# Patient Record
Sex: Male | Born: 1980 | Race: White | Hispanic: No | Marital: Married | Smoking: Current every day smoker
Health system: Southern US, Community
[De-identification: ages and names within clinical notes are randomized; demographics above are authoritative.]

## PROBLEM LIST (undated history)

## (undated) DIAGNOSIS — G4700 Insomnia, unspecified: Secondary | ICD-10-CM

## (undated) DIAGNOSIS — F99 Mental disorder, not otherwise specified: Secondary | ICD-10-CM

## (undated) DIAGNOSIS — I10 Essential (primary) hypertension: Secondary | ICD-10-CM

## (undated) DIAGNOSIS — F419 Anxiety disorder, unspecified: Secondary | ICD-10-CM

## (undated) DIAGNOSIS — F319 Bipolar disorder, unspecified: Secondary | ICD-10-CM

## (undated) DIAGNOSIS — K219 Gastro-esophageal reflux disease without esophagitis: Secondary | ICD-10-CM

## (undated) DIAGNOSIS — F1990 Other psychoactive substance use, unspecified, uncomplicated: Secondary | ICD-10-CM

## (undated) DIAGNOSIS — F909 Attention-deficit hyperactivity disorder, unspecified type: Secondary | ICD-10-CM

---

## 1999-09-27 ENCOUNTER — Emergency Department (HOSPITAL_COMMUNITY): Admission: EM | Admit: 1999-09-27 | Discharge: 1999-09-27 | Payer: Self-pay | Admitting: *Deleted

## 1999-11-11 ENCOUNTER — Emergency Department (HOSPITAL_COMMUNITY): Admission: EM | Admit: 1999-11-11 | Discharge: 1999-11-11 | Payer: Self-pay | Admitting: Emergency Medicine

## 1999-12-23 ENCOUNTER — Emergency Department (HOSPITAL_COMMUNITY): Admission: EM | Admit: 1999-12-23 | Discharge: 1999-12-23 | Payer: Self-pay | Admitting: Emergency Medicine

## 1999-12-23 ENCOUNTER — Encounter: Payer: Self-pay | Admitting: Emergency Medicine

## 2000-06-20 ENCOUNTER — Emergency Department (HOSPITAL_COMMUNITY): Admission: EM | Admit: 2000-06-20 | Discharge: 2000-06-20 | Payer: Self-pay | Admitting: Emergency Medicine

## 2000-06-20 ENCOUNTER — Encounter: Payer: Self-pay | Admitting: Emergency Medicine

## 2000-07-20 ENCOUNTER — Encounter: Payer: Self-pay | Admitting: Emergency Medicine

## 2000-07-20 ENCOUNTER — Emergency Department (HOSPITAL_COMMUNITY): Admission: EM | Admit: 2000-07-20 | Discharge: 2000-07-21 | Payer: Self-pay | Admitting: Emergency Medicine

## 2007-04-13 ENCOUNTER — Emergency Department (HOSPITAL_COMMUNITY): Admission: EM | Admit: 2007-04-13 | Discharge: 2007-04-13 | Payer: Self-pay | Admitting: Emergency Medicine

## 2007-06-29 ENCOUNTER — Emergency Department: Payer: Self-pay | Admitting: Emergency Medicine

## 2007-07-21 ENCOUNTER — Emergency Department (HOSPITAL_COMMUNITY): Admission: EM | Admit: 2007-07-21 | Discharge: 2007-07-21 | Payer: Self-pay | Admitting: Emergency Medicine

## 2007-07-21 ENCOUNTER — Ambulatory Visit (HOSPITAL_COMMUNITY): Admission: RE | Admit: 2007-07-21 | Discharge: 2007-07-21 | Payer: Self-pay | Admitting: Family Medicine

## 2007-07-27 ENCOUNTER — Emergency Department (HOSPITAL_COMMUNITY): Admission: EM | Admit: 2007-07-27 | Discharge: 2007-07-27 | Payer: Self-pay | Admitting: Emergency Medicine

## 2009-04-19 ENCOUNTER — Emergency Department (HOSPITAL_COMMUNITY): Admission: EM | Admit: 2009-04-19 | Discharge: 2009-04-20 | Payer: Self-pay | Admitting: Emergency Medicine

## 2009-12-30 IMAGING — CR DG FOOT COMPLETE 3+V*L*
1 series · 3 of 3 positions shown · non-contrast
Comparison: none

REASON FOR EXAM: PAINFUL AND SWOLLEN FROM FALL
COMMENTS:

PROCEDURE:     DXR - DXR FOOT LT COMP W/OBLIQUES  - June 29, 2007  [DATE]
RESULT:     Comparison: No available comparison exam.

[Series 1: view not recorded · 0.17mm/px · 3 of 3 slices shown]
[im 1/3]
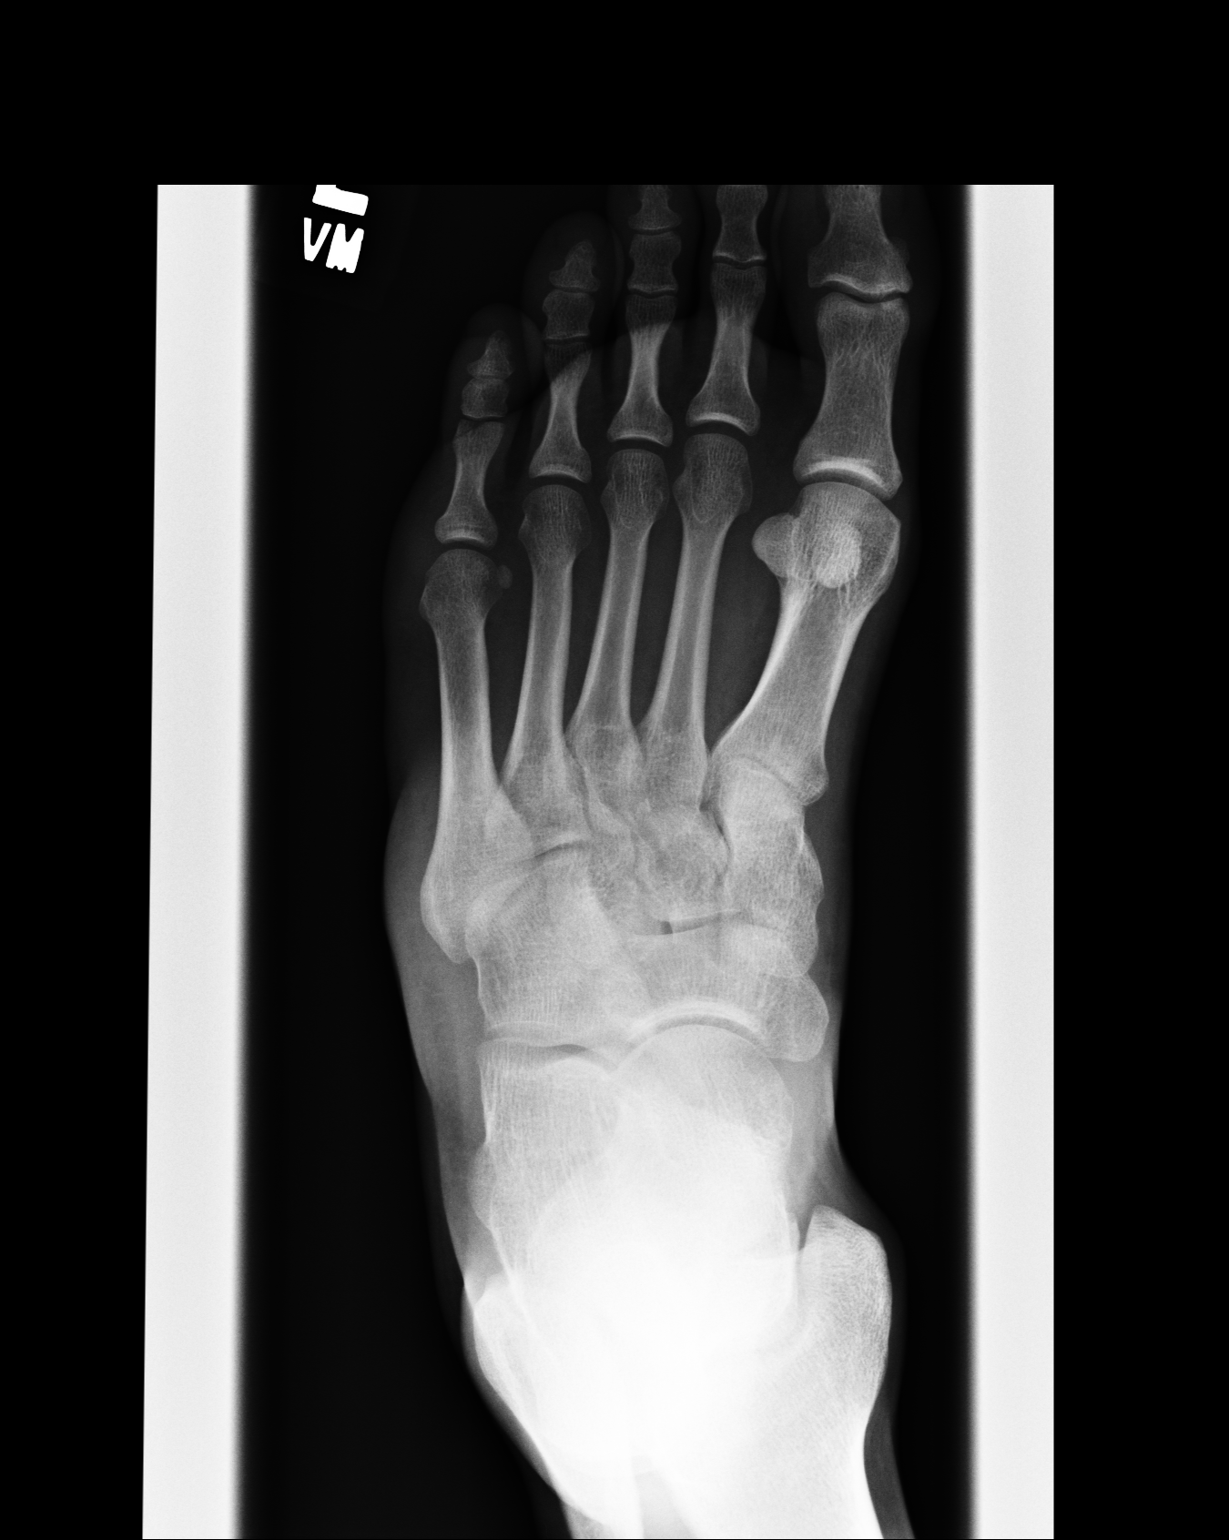
[im 2/3]
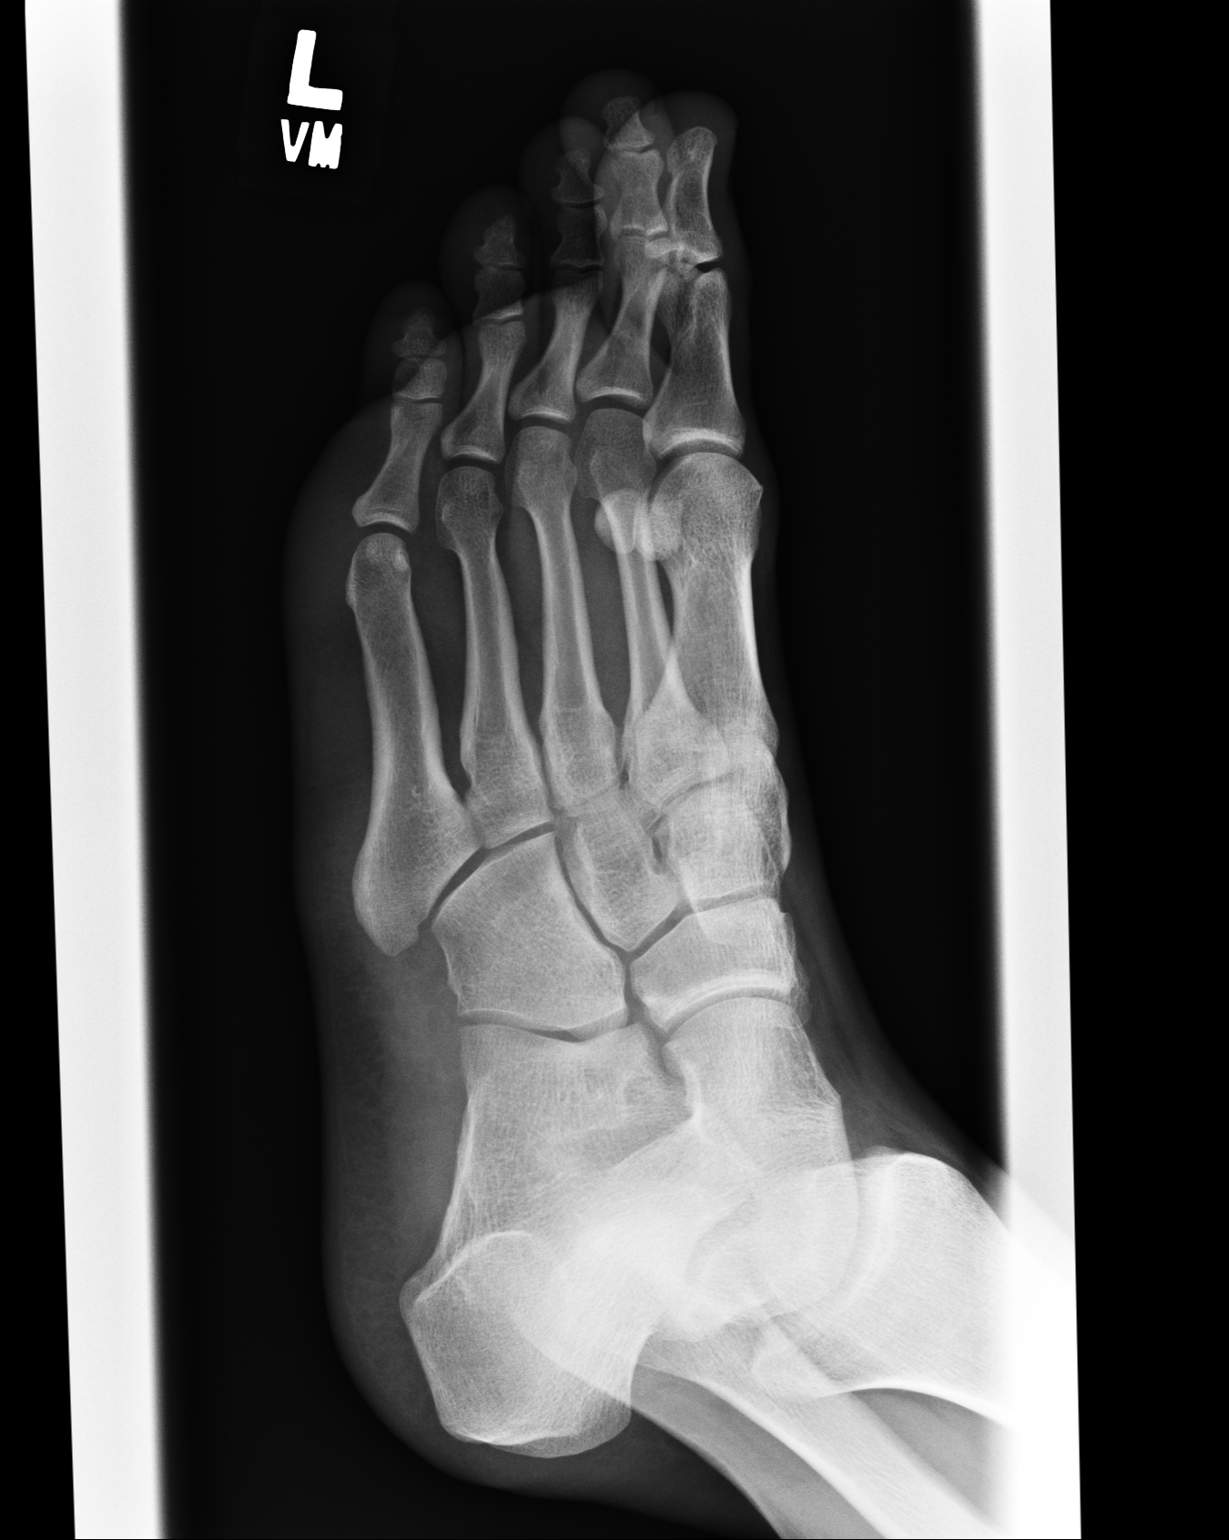
[im 3/3]
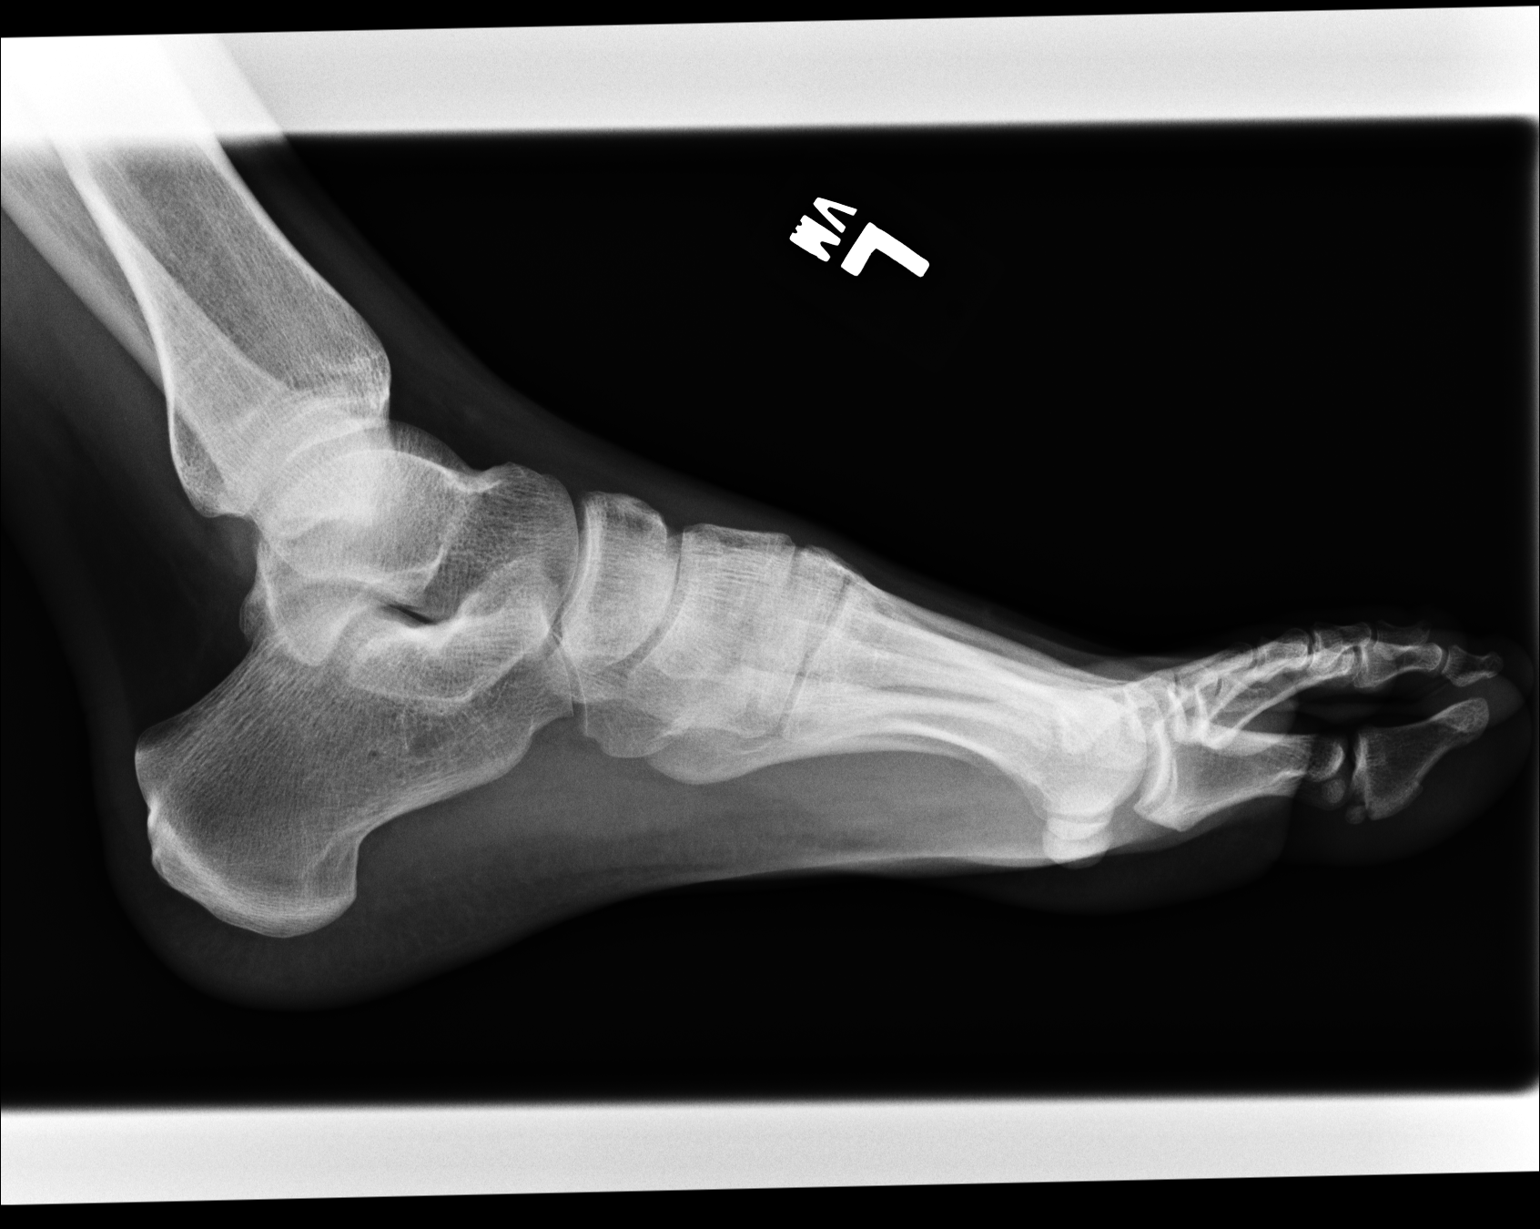

[3 of 3 positions shown; findings below may reference images not displayed]

FINDINGS: Three views of the left foot were obtained.

No fracture or dislocation of the left foot is noted. No significant joint
abnormality is seen.
IMPRESSION: 1. No fracture or dislocation of the left foot is noted.

## 2010-04-22 LAB — POCT I-STAT, CHEM 8
Chloride: 108 mEq/L (ref 96–112)
HCT: 46 % (ref 39.0–52.0)
Hemoglobin: 15.6 g/dL (ref 13.0–17.0)
Potassium: 3.7 mEq/L (ref 3.5–5.1)
Sodium: 140 mEq/L (ref 135–145)

## 2010-04-22 LAB — URINALYSIS, ROUTINE W REFLEX MICROSCOPIC
Bilirubin Urine: NEGATIVE
Glucose, UA: NEGATIVE mg/dL
Hgb urine dipstick: NEGATIVE
Ketones, ur: 15 mg/dL — AB
Nitrite: NEGATIVE
Protein, ur: NEGATIVE mg/dL
Specific Gravity, Urine: 1.023 (ref 1.005–1.030)
Urobilinogen, UA: 0.2 mg/dL (ref 0.0–1.0)
pH: 6 (ref 5.0–8.0)

## 2010-04-22 LAB — DIFFERENTIAL
Basophils Relative: 0 % (ref 0–1)
Eosinophils Absolute: 0 10*3/uL (ref 0.0–0.7)
Lymphs Abs: 1.6 10*3/uL (ref 0.7–4.0)
Neutro Abs: 8.6 10*3/uL — ABNORMAL HIGH (ref 1.7–7.7)
Neutrophils Relative %: 78 % — ABNORMAL HIGH (ref 43–77)

## 2010-04-22 LAB — CBC
MCHC: 34.6 g/dL (ref 30.0–36.0)
MCV: 90.1 fL (ref 78.0–100.0)
Platelets: 229 10*3/uL (ref 150–400)
WBC: 11 10*3/uL — ABNORMAL HIGH (ref 4.0–10.5)

## 2010-04-22 LAB — RAPID URINE DRUG SCREEN, HOSP PERFORMED
Amphetamines: NOT DETECTED
Barbiturates: NOT DETECTED
Benzodiazepines: POSITIVE — AB
Tetrahydrocannabinol: POSITIVE — AB

## 2010-10-24 LAB — I-STAT 8, (EC8 V) (CONVERTED LAB)
Acid-base deficit: 2
HCT: 47
Hemoglobin: 16
Potassium: 3.5
Sodium: 139
TCO2: 20

## 2010-10-27 LAB — DIFFERENTIAL
Basophils Absolute: 0
Basophils Relative: 0
Basophils Relative: 0
Eosinophils Absolute: 0.2
Eosinophils Relative: 2
Lymphocytes Relative: 24
Lymphs Abs: 2.7
Monocytes Absolute: 0.8
Monocytes Absolute: 1.1 — ABNORMAL HIGH
Monocytes Relative: 10
Monocytes Relative: 8
Neutro Abs: 7.3
Neutro Abs: 8.3 — ABNORMAL HIGH
Neutrophils Relative %: 64

## 2010-10-27 LAB — CBC
HCT: 41.4
HCT: 44
Hemoglobin: 14.1
Hemoglobin: 15.5
MCHC: 34
MCHC: 35.2
MCV: 87.7
MCV: 88.3
Platelets: 291
RBC: 4.69
RBC: 5.02
RDW: 12.2
WBC: 11.3 — ABNORMAL HIGH

## 2010-10-27 LAB — COMPREHENSIVE METABOLIC PANEL
ALT: 12
AST: 15
Albumin: 4.2
Alkaline Phosphatase: 68
BUN: 11
CO2: 30
Calcium: 9.5
Chloride: 106
Creatinine, Ser: 0.97
GFR calc Af Amer: >60
GFR calc non Af Amer: >60
Glucose, Bld: 76
Potassium: 4.2
Sodium: 141
Total Bilirubin: 0.6
Total Protein: 7.3

## 2010-10-27 LAB — POCT URINALYSIS DIP (DEVICE)
Bilirubin Urine: NEGATIVE
Glucose, UA: NEGATIVE
Hgb urine dipstick: NEGATIVE
Specific Gravity, Urine: 1.025

## 2013-03-05 ENCOUNTER — Emergency Department (HOSPITAL_COMMUNITY)
Admission: EM | Admit: 2013-03-05 | Discharge: 2013-03-05 | Disposition: A | Discharge status: Home / Self Care | Payer: Medicaid Other | Attending: Emergency Medicine | Admitting: Emergency Medicine

## 2013-03-05 ENCOUNTER — Encounter (HOSPITAL_COMMUNITY): Payer: Self-pay | Admitting: Emergency Medicine

## 2013-03-05 DIAGNOSIS — Z79899 Other long term (current) drug therapy: Secondary | ICD-10-CM | POA: Insufficient documentation

## 2013-03-05 DIAGNOSIS — F17200 Nicotine dependence, unspecified, uncomplicated: Secondary | ICD-10-CM | POA: Insufficient documentation

## 2013-03-05 DIAGNOSIS — F1910 Other psychoactive substance abuse, uncomplicated: Secondary | ICD-10-CM

## 2013-03-05 DIAGNOSIS — F1310 Sedative, hypnotic or anxiolytic abuse, uncomplicated: Secondary | ICD-10-CM | POA: Insufficient documentation

## 2013-03-05 DIAGNOSIS — F1410 Cocaine abuse, uncomplicated: Secondary | ICD-10-CM | POA: Insufficient documentation

## 2013-03-05 HISTORY — DX: Anxiety disorder, unspecified: F41.9

## 2013-03-05 HISTORY — DX: Mental disorder, not otherwise specified: F99

## 2013-03-05 LAB — BASIC METABOLIC PANEL
BUN: 12 mg/dL (ref 6–23)
CALCIUM: 9.8 mg/dL (ref 8.4–10.5)
CHLORIDE: 100 meq/L (ref 96–112)
CO2: 24 mEq/L (ref 19–32)
CREATININE: 0.96 mg/dL (ref 0.50–1.35)
GFR calc non Af Amer: >90 mL/min (ref >90)
Glucose, Bld: 130 mg/dL — ABNORMAL HIGH (ref 70–99)
Potassium: 4.5 mEq/L (ref 3.7–5.3)
SODIUM: 137 meq/L (ref 137–147)

## 2013-03-05 LAB — CBC WITH DIFFERENTIAL/PLATELET
BASOS ABS: 0 10*3/uL (ref 0.0–0.1)
BASOS PCT: 0 % (ref 0–1)
Eosinophils Absolute: 0 10*3/uL (ref 0.0–0.7)
Eosinophils Relative: 0 % (ref 0–5)
HEMATOCRIT: 45.1 % (ref 39.0–52.0)
Hemoglobin: 15.6 g/dL (ref 13.0–17.0)
Lymphocytes Relative: 15 % (ref 12–46)
Lymphs Abs: 1.6 10*3/uL (ref 0.7–4.0)
MCH: 30 pg (ref 26.0–34.0)
MCHC: 34.6 g/dL (ref 30.0–36.0)
MCV: 86.7 fL (ref 78.0–100.0)
MONO ABS: 0.7 10*3/uL (ref 0.1–1.0)
Monocytes Relative: 6 % (ref 3–12)
NEUTROS ABS: 8.3 10*3/uL — AB (ref 1.7–7.7)
NEUTROS PCT: 78 % — AB (ref 43–77)
PLATELETS: 278 10*3/uL (ref 150–400)
RBC: 5.2 MIL/uL (ref 4.22–5.81)
RDW: 12.9 % (ref 11.5–15.5)
WBC: 10.7 10*3/uL — AB (ref 4.0–10.5)

## 2013-03-05 LAB — RAPID URINE DRUG SCREEN, HOSP PERFORMED
AMPHETAMINES: NOT DETECTED
Barbiturates: NOT DETECTED
Benzodiazepines: NOT DETECTED
COCAINE: POSITIVE — AB
OPIATES: NOT DETECTED
Tetrahydrocannabinol: POSITIVE — AB

## 2013-03-05 LAB — ETHANOL: Alcohol, Ethyl (B): <11 mg/dL (ref 0–11)

## 2013-03-05 LAB — ACETAMINOPHEN LEVEL

## 2013-03-05 LAB — SALICYLATE LEVEL

## 2013-03-05 MED ORDER — NICOTINE 21 MG/24HR TD PT24: 21.0000 mg | MEDICATED_PATCH | Freq: Every day | TRANSDERMAL | Status: DC

## 2013-03-05 MED ORDER — ACETAMINOPHEN 325 MG PO TABS: 650.0000 mg | ORAL_TABLET | ORAL | Status: DC | PRN

## 2013-03-05 MED ORDER — ONDANSETRON HCL 4 MG PO TABS: 4.0000 mg | ORAL_TABLET | Freq: Three times a day (TID) | ORAL | Status: DC | PRN

## 2013-03-05 MED ORDER — ZOLPIDEM TARTRATE 5 MG PO TABS
5.0000 mg | ORAL_TABLET | Freq: Every evening | ORAL | Status: DC | PRN
Start: 2013-03-05 — End: 2013-03-05

## 2013-03-05 MED ORDER — IBUPROFEN 200 MG PO TABS: 600.0000 mg | ORAL_TABLET | Freq: Three times a day (TID) | ORAL | Status: DC | PRN

## 2013-03-05 MED ORDER — LORAZEPAM 1 MG PO TABS
1.0000 mg | ORAL_TABLET | Freq: Four times a day (QID) | ORAL | Status: DC | PRN
Start: 2013-03-05 — End: 2013-03-05

## 2013-03-05 NOTE — ED Notes (Signed)
Pt transferred from triage, presents for detox off cocaine, opiates, and Xanax.  Last used 11am.  Reports pt relapsed 3 wks ago.  Denies feeling hopeless.  Denies SI, HI or AV hallucinations.  Admits to past hx of Bipolar DO, ADHD, Anxiety and Insomnia.  Pt calm & cooperative at present.

## 2013-03-05 NOTE — ED Notes (Signed)
Patient has been wanded by security. Patients wife will come pick up belongings from triage nursing station.

## 2013-03-05 NOTE — BH Assessment (Signed)
Pt assessed by TTS and inpatient detox recommended. Pt's nurse Rashell informed TTS that patient is requesting to leave as he does not want to wait any longer. Rashell reports that EDP has been notified about the situation.   Erik PeachNajah Jonovan Boedecker, MS, LCASA Assessment Counselor

## 2013-03-05 NOTE — ED Notes (Signed)
Writer called Dr. Gwendolyn GrantWalden and informed him that patient wanted to leave. Dr. Gwendolyn GrantWalden told me to tell patient he would be over to talk with him. Writer informed patient that the Dr. would be over to speak with him.

## 2013-03-05 NOTE — ED Notes (Signed)
Dr. Gwendolyn GrantWalden in to speak with patient.

## 2013-03-05 NOTE — BH Assessment (Signed)
Consulted with EDP Dr. Gwendolyn GrantWalden who consulted with patient and D/C pt as pt refused to wait for detox placement.   Glorious PeachNajah Leathia Farnell, MS, LCASA Assessment Counselor

## 2013-03-05 NOTE — ED Notes (Signed)
Informed patient what Renda Rollsajah, TTS counselor stated and he reported that he was still leaving.

## 2013-03-05 NOTE — BH Assessment (Signed)
Consulted with PA Jaynie Crumbleatyana Kirichenko to obtain clinicals prior to assessing patient.  Glorious PeachNajah Paxton Kanaan, MS, LCASA Assessment Counselor

## 2013-03-05 NOTE — ED Notes (Signed)
No acute distress noted. Patient denies SI, HI and AVH

## 2013-03-05 NOTE — ED Notes (Signed)
Per pt, states he wants detox off cocaine, opiates and benzos-relapsed 3 weeks ago-last used 3 hours ago

## 2013-03-05 NOTE — Discharge Instructions (Signed)
Drug Abuse and Addiction in Sports There are many types of drugs that one may become addicted to including illegal drugs (marijuana, cocaine, amphetamines, hallucinogens, and narcotics), prescription drugs (hydrocodone, codeine, and alprazolam), and other chemicals such as alcohol or nicotine. Two types of addiction exist: physical and emotional. Physical addiction usually occurs after prolonged use of a drug. However, some drugs may only take a couple uses before addiction can occur. Physical addiction is marked by withdrawal symptoms, in which the person experiences negative symptoms such as sweat, anxiety, tremors, hallucinations, or cravings in the absence of using the drug. Emotional dependence is the psychological desire for the "high" that the drugs produce when taken. SYMPTOMS   Inattentiveness.  Negligence.  Forgetfulness.  Insomnia.  Mood swings. RISK INCREASES WITH:   Family history of addiction.  Personal history of addictive personality. Studies have shown that risktakers, which many athletes are, have a higher risk of addiction. PREVENTION The only adequate prevention of drug abuse is abstinence from drugs. TREATMENT  The first step in quitting substance abuse is recognizing the problem and realizing that one has the power to change. Quitting requires a plan and support from others. It is often necessary to seek medical assistance. Caregivers are available to offer counseling, and for certain cases, medicine to diminish the physical symptoms of withdrawal. Many organizations exist such as Alcoholics Anonymous, Narcotics Anonymous, or the [employer] Council on Alcoholism that offer support for individuals who have chosen to quit their habits. Document Released: 01/16/2005 Document Revised: 04/10/2011 Document Reviewed: 04/30/2008 [facility] Patient Information 2014 [place], [place].   Emergency Department Resource Guide 1) Find a Doctor and Pay Out of Pocket Although you  won't have to find out who is covered by your insurance plan, it is a good idea to ask around and get recommendations. You will then need to call the office and see if the doctor you have chosen will accept you as a new patient and what types of options they offer for patients who are self-pay. Some doctors offer discounts or will set up payment plans for their patients who do not have insurance, but you will need to ask so you aren't surprised when you get to your appointment.  2) Contact Your Local Health Department Not all health departments have doctors that can see patients for sick visits, but many do, so it is worth a call to see if yours does. If you don't know where your local health department is, you can check in your phone book. The CDC also has a tool to help you locate your state's health department, and many state websites also have listings of all of their local health departments.  3) Find a Walk-in Clinic If your illness is not likely to be very severe or complicated, you may want to try a walk in clinic. These are popping up all over the country in pharmacies, drugstores, and shopping centers. They're usually staffed by nurse practitioners or physician assistants that have been trained to treat common illnesses and complaints. They're usually fairly quick and inexpensive. However, if you have serious medical issues or chronic medical problems, these are probably not your best option.  No Primary Care Doctor: - Call Health Connect at  [phone] - they can help you locate a primary care doctor that  accepts your insurance, provides certain services, etc. - Physician Referral Service- [phone]  Chronic Pain Problems: Organization         Address  Phone   Notes  [facility]  [phone] Patients need to be referred by their primary care doctor.   Medication Assistance: Organization         Address  Phone   Notes  [facility] Medication [facility] [address] [place], [place] [address] [phone] --Must be a resident of [facility] -- Must have NO insurance coverage whatsoever (no Medicaid/ Medicare, etc.) -- The pt. MUST have a primary care doctor that directs their care regularly and follows them in the community   [facility]  [phone]   [employer]  [phone]    Agencies that provide inexpensive medical care: Organization         Address  Phone   Notes  [facility]  [phone]   [facility]    [phone]   [facility] [address] [place], [place] [address] [phone]   [facility] [address], [place] [phone]   [facility] Parenthood    [phone]   [facility]    [phone]   [facility]  [address], [place] Phone:  [phone], Fax:  [phone] Hours of Operation:  9 am - 6 pm, M-F.  Also accepts Medicaid/Medicare and self-pay.  [facility]  [address], [place] Phone: [phone], Fax: [phone]. Hours of Operation:  8:30 am - 5:30 pm, M-F.  Also accepts Medicaid and self-pay.  [facility] [place] [address] [place] Phone: [phone]   [facility] [address] Natasha Bence [place], [place] [phone], Ext. [phone] Mondays & Thursdays: 7-9 AM.  First 15 patients are seen on a first come, first serve basis.    Medicaid-accepting [employer] Providers:  Organization         Address  Phone   Notes  [facility] [address],  [phone] Also accepts self-pay patients.  [facility] [address] Laurell Josephs [place], [place]  [phone]   [facility] [address], [place] [phone]   [facility] [address] [place] [phone]   Renaye Rakers [address], [place]   [phone] Only accepts [place] Access [place] patients after they have their name applied to their card.   Self-Pay (no insurance) in [facility]:  Organization         Address  Phone   Notes  Sickle Cell Patients, [facility] Internal Medicine [address] [place], [place] [phone]   [facility] [address] [place], [place] [phone]   [facility] [place]  [address], [place] [phone]   [facility]/Dr. Osei-Bonsu  [address] [place] or [address], [place] [phone] Phone number for both [place] and [place] locations is the same.  [facility]gent [facility] and [facility] [address], [place] [phone]   [facility] [address], [place] or [address] [phone]   [facility] [address] [place] [phone], phone; [phone], fax Sees patients 1st and 3rd Saturday of every month.  Must not qualify for public or private insurance (i.e. Medicaid, Medicare,  [place] Health Choice, Veterans' Benefits)  Household income should be no more than 200% of the poverty level The clinic cannot treat you if you are pregnant or think you are pregnant  Sexually transmitted diseases are not treated at the clinic.    Dental Care: Organization         Address  Phone  Notes  [facility] Department of [facility] [address] [place], [place] [phone] Accepts children up to age 46 who are enrolled in [place] or [place] Health Choice; pregnant women with a Medicaid card; and children who have applied for Medicaid or [place] Health Choice, but were declined, whose parents can pay a reduced fee at time of service.  [facility] Department of [facility]  [address], [place] [phone] Accepts  children up to age 60 who are enrolled in [place] or [place] Health Choice; pregnant women with a Medicaid card; and children who have applied for Medicaid or [place] Health Choice, but were declined, whose parents can pay a reduced fee at time of service.  [facility]  [address] [place], [place] [phone] Patients are seen by appointment only. Walk-ins are not accepted. [facility] will see patients 55 years of age and older. Monday - Tuesday (8am-5pm) Most Wednesdays (8:30-5pm) $30 per visit, cash only  [facility]  [address], [facility] [phone] Patients are seen by appointment only. Walk-ins are not accepted. [facility] will see patients 16 years of age and older. One Wednesday Evening (Monthly: Volunteer Based).  $30 per visit, cash only  [employer]  [phone] for adults; Children under age 9, call Graduate Pediatric Dentistry at [phone]. Children aged 2-14, please call [phone] to request a pediatric application.  Dental services are provided in all areas of dental care including fillings, crowns and bridges, complete and partial dentures, implants, gum treatment, root canals, and extractions. Preventive care is also provided. Treatment is provided to both adults and children. Patients are selected via a lottery and there is often a waiting list.   [facility] [address], [place]  [phone] [email or web address]   [facility] [address] [place], [place] [phone], Ext. [phone] Second and Fourth Thursday of each month, opens at 6:30 AM; Clinic ends at 9 AM.  Patients are seen on a first-come first-served basis, and a limited number are seen during each clinic.   [facility]  [address] Ether Griffins [place], [place] [phone]   Eligibility Requirements You must have lived in [place], [place], or [place] counties for at least the  last three months.   You cannot be eligible for state or federal sponsored [place], including [employer], [place], or [employer].   You generally cannot be eligible for healthcare insurance through your employer.    How to apply: Eligibility screenings are held every Tuesday and Wednesday afternoon from 1:00 pm until 4:00 pm. You do not need an appointment for the interview!  [facility] [address], [place], [place] [phone]   [facility] Health Department  [phone]   [facility]  [phone]   [facility] Department  [phone]    Behavioral Health Resources in the Community: Intensive Outpatient Programs Organization         Address  Phone  Notes  [facility] Health Services  [address] [place], [place] [phone]   [facility]patient [address], [place], [place] [phone]   [facility] [address], [place], [place]  [phone]   [facility] Mental Health [address],  [place], [place] [phone] or [phone]   Substance Abuse Resources Organization         Address  Phone  Notes  [facility] Drug Services  [phone]   [facility]  [phone]   [address]  [phone]   Floydene Flock  [phone]   Residential & Outpatient Substance Abuse Program  [phone]   Psychological Services Organization         Address  Phone  Notes  [facility] Behavioral Health  [phone]   [facility] Services  [phone]   [facility] Mental Health [address], [place] [phone] or [phone]    Mobile Crisis Teams Organization         Address  Phone  Notes  [facility], Mobile Crisis Care Unit  [phone]   [facility]  [address] [place], [place] [phone]   Doristine Locks [address] [place] [phone]     Self-Help/Support Groups Organization         Address  Phone             Notes  [facility] [place] - variety of support groups  [phone]- [phone] Call for more information  Narcotics Anonymous (NA), [facility] [address], [employer] [place]  2 meetings at this location   Statistician         Address  Phone  Notes  [facility] [address],    [place]  [phone]   [facility]  [address], [place] [address], [place], [place] [phone]   [facility] [address] [place], [place] [phone] Admissions: 8am-3pm M-F  [facility] [address],    [place], [place] [phone]   [facility] [address] [place], [place], [place] [phone]   [facility] [address],  [place], [place] [phone]   [facility] - Intensive Outpatient [address] Laurell Josephs [address], [place], [place] [phone]   [facility] ([facility] [address] [place].,  [place], [place] [phone] or [phone]   [facility] ([facility]) [address] [place], [place] [phone] Accepts Medicaid  [facility] [place] [address],  [place] [phone] Substance Abuse/Addiction Treatment   [facility] Organization         Address  Phone  Notes  [facility]  [phone]   Angie Fava, PhD [address] Ervin Knack [place], [place]   [phone] or [phone]   [facility] Behavioral   [address] [place], [place] [phone]   [facility] [address] [place], [place] [phone] Insurance/Medicaid/sponsorship through [facility] and Families [address]                                    [place], [place] [phone] Therapy/tele-psych/case  [facility] [address], [place] [phone]    Dr. Lolly Mustache  [phone]   Free  [facility] [place]  [employer] [facility] Dept. 1) [address], [place] 2) [place] 3)  [place] [address], [place] [phone]  [phone]   [place] [phone] or [phone] (After Hours)

## 2013-03-05 NOTE — ED Provider Notes (Signed)
CSN: [id number]     Arrival date & time 03/05/13  1419 History   First MD Initiated Contact with Patient 03/05/13 1505     Chief Complaint  Patient presents with  . Medical Clearance   (Consider location/radiation/quality/duration/timing/severity/associated sxs/prior Treatment) HPI Erik Davila is a 33 y.o. male who presents to ED requesting cocaine, and benzodiazapine detox. Pt states he has polysubstance dependency. Currently on saboxone for opiod addition, has been on it for 2 months. States has been clean for 3 years up until 3 months ago when he relapsed. States using daily now for 3 weeks. States unable to stop, and when he does, states feels sick. Last used today. States wants to try to get inpatient, but if unable states just needs some resources and medications to help him with withdrawal symptoms. Denies depression, SI, or HI. Denies alcohol use. No other medical complaints.     History reviewed. No pertinent past medical history. History reviewed. No pertinent past surgical history. No family history on file. History  Substance Use Topics  . Smoking status: Current Every Day Smoker    Types: Cigarettes  . Smokeless tobacco: Not on file  . Alcohol Use: No    Review of Systems  Constitutional: Negative for fever and chills.  Respiratory: Negative.   Cardiovascular: Negative.   Psychiatric/Behavioral: Negative for suicidal ideas and behavioral problems. The patient is not nervous/anxious.   All other systems reviewed and are negative.    Allergies  Keflex  Home Medications   Current Outpatient Rx  Name  Route  Sig  Dispense  Refill  . buprenorphine-naloxone (SUBOXONE) 8-2 MG SUBL SL tablet   Sublingual   Place 1 tablet under the tongue daily.          BP 138/86  Pulse 99  Temp(Src) 98.5 F (36.9 C) (Oral)  Resp 18  SpO2 100% Physical Exam  Nursing note and vitals reviewed. Constitutional: He appears well-developed and well-nourished. No distress.  HENT:   Head: Normocephalic and atraumatic.  Eyes: Conjunctivae are normal.  Neck: Neck supple.  Cardiovascular: Normal rate, regular rhythm and normal heart sounds.   Pulmonary/Chest: Effort normal. No respiratory distress. He has no wheezes. He has no rales.  Abdominal: Soft. Bowel sounds are normal. He exhibits no distension. There is no tenderness. There is no rebound.  Musculoskeletal: He exhibits no edema.  Neurological: He is alert.  Skin: Skin is warm and dry.  Psychiatric: He has a normal mood and affect. His behavior is normal. Thought content normal.    ED Course  Procedures (including critical care time) Labs Review Labs Reviewed  CBC WITH DIFFERENTIAL - Abnormal; Notable for the following:    WBC 10.7 (*)    Neutrophils Relative % 78 (*)    Neutro Abs 8.3 (*)    All other components within normal limits  BASIC METABOLIC PANEL - Abnormal; Notable for the following:    Glucose, Bld 130 (*)    All other components within normal limits  URINE RAPID DRUG SCREEN (HOSP PERFORMED) - Abnormal; Notable for the following:    Cocaine POSITIVE (*)    Tetrahydrocannabinol POSITIVE (*)    All other components within normal limits  SALICYLATE LEVEL - Abnormal; Notable for the following:    Salicylate Lvl <2.0 (*)    All other components within normal limits  ETHANOL  ACETAMINOPHEN LEVEL   Imaging Review No results found.  EKG Interpretation   None       MDM  1. Drug abuse     Pt requesting detox from benzos and cocaine. Will get clearance labs and TTS consult. Pt denies SI or HI. Here voluntarily.    4:18 PM Pt medically cleared. Ordered holding orderes and TSS consult.   Lottie Mussel, PA-C 03/05/13 2330

## 2013-03-05 NOTE — ED Notes (Signed)
Spoke with Renda RollsNajah TTS counsleor and reported to her that patient wanted to leave and she told me to inform patient that she was processing him now to get a bed.

## 2013-03-05 NOTE — ED Notes (Signed)
Patient asked if he could use telephone. Writer gave patient telephone and patient was telling someone on the phone he was leaving. When patient got off the phone writer asked patient was everything ok. Patient reported that he was leaving. I told patient I would speak with the TTS counselor and find out what was going on. Patient stated he was leaving.

## 2013-03-06 NOTE — ED Provider Notes (Signed)
Medical screening examination/treatment/procedure(s) were performed by non-physician practitioner and as supervising physician I was immediately available for consultation/collaboration.  EKG Interpretation   None        Nikole Swartzentruber F Adalina Dopson, MD 03/06/13 0031 

## 2013-03-06 NOTE — BH Assessment (Addendum)
Assessment Note  Erik Davila is an 33 y.o. male. [facility] requesting detox from Xanax(Benzo's) and Cocaine as he reports that he is prescribed Suboxen for his opiate dependence. Pt reports using Xanax daily average 10-20 1mg  pills for the past month. Pt reports snorting Cocaine at least 4x per week spending anywhere from $50-$100 per day. Pt reports that he relapsed on Cocaine and Xanax 3-4 weeks ago.  Pt reports that he served time in prison for a Breaking and Entering and Larceny charge and was released in March of 2014.  Pt reports relapsing on Opiates several months after being released from prison and was prescribed suboxen.  Pt denies SI,Hi, and no AVH reported.  Consulted with AC Thurman CoyerEric Kaplan and NP Janann Augustori Burkett who were in the process of reviewing pt's referral when pt decided that he wanted to leave the ER and no longer wanted to wait to be placed in a detox program. Dr. Gwendolyn GrantWalden D/C the patient and patient left ED before crisis resources and SA referrals could be provided by [facility].  Axis I: Opioid Dependence,Benzodiazepine Abuse Axis II: Deferred Axis III:  Past Medical History  Diagnosis Date  . Anxiety   . Mental disorder    Axis IV: other psychosocial or environmental problems, problems related to legal system/crime and problems related to social environment Axis V: 41-50 serious symptoms  Past Medical History:  Past Medical History  Diagnosis Date  . Anxiety   . Mental disorder     History reviewed. No pertinent past surgical history.  Family History: No family history on file.  Social History:  reports that he has been smoking Cigarettes.  He has been smoking about 1.50 packs per day. He does not have any smokeless tobacco history on file. He reports that he uses illicit drugs (Cocaine and Benzodiazepines). He reports that he does not drink alcohol.  Additional Social History:  Alcohol / Drug Use History of alcohol / drug use?:  Yes Negative Consequences of Use: Legal Substance #1 Name of Substance 1:  (xanax) 1 - Age of First Use:  (17) 1 - Amount (size/oz):  (10-20 (1)mg pills) 1 - Frequency:  (daily) 1 - Duration:  (past month) 1 - Last Use / Amount:  (03/05/13-2mg ) Substance #2 Name of Substance 2:  (Cocaine) 2 - Age of First Use:  (17) 2 - Amount (size/oz):  ($50-$100 per day) 2 - Frequency:  (4x per week) 2 - Duration:  (on-going) 2 - Last Use / Amount:  (03-04-13-1 gram)  CIWA: CIWA-Ar BP: 131/90 mmHg Pulse Rate: 70 COWS:    Allergies:  Allergies  Allergen Reactions  . Keflex [Cephalexin] Rash    Abdominal pain    Home Medications:  (Not in a hospital admission)  OB/GYN Status:  No LMP for male patient.  General Assessment Data Location of Assessment: [facility] Is this a Tele or Face-to-Face Assessment?: Face-to-Face Is this an Initial Assessment or a Re-assessment for this encounter?: Initial Assessment Living Arrangements: Spouse/significant other;Children Can pt return to current living arrangement?: Yes Admission Status: Voluntary Is patient capable of signing voluntary admission?: Yes Transfer from: Home Referral Source: Other [facility])     [facility] Living Arrangements: Spouse/significant other;Children Name of Psychiatrist: No Current Provider Name of Therapist: No Current Provider     Risk to self Suicidal Ideation: No Suicidal Intent: No Is patient at risk for suicide?: No Suicidal Plan?: No Access to Means: No What has  been your use of drugs/alcohol within the last 12 months?: Cocaine,Xanax Previous Attempts/Gestures: No How many times?: 0 Other Self Harm Risks: none reported Triggers for Past Attempts: None known Intentional Self Injurious Behavior: None Family Suicide History: No Recent stressful life event(s): Other (Comment) (addiction) Persecutory voices/beliefs?: No Depression: No Substance abuse history and/or treatment for  substance abuse?: Yes Suicide prevention information given to non-admitted patients: Not applicable  Risk to Others Homicidal Ideation: No Thoughts of Harm to Others: No Current Homicidal Intent: No Current Homicidal Plan: No Access to Homicidal Means: No Identified Victim: na History of harm to others?: No Assessment of Violence: None Noted Violent Behavior Description: None Reported-Coopertative during assessment Does patient have access to weapons?: No Criminal Charges Pending?: No Does patient have a court date: No  Psychosis Hallucinations: None noted Delusions: None noted  Mental Status Report Appear/Hygiene: Other (Comment) (Appropriate) Eye Contact: Fair Motor Activity: Freedom of movement Speech: Logical/coherent Level of Consciousness: Alert Mood: Anxious Affect: Anxious;Appropriate to circumstance Anxiety Level: Minimal Thought Processes: Coherent;Relevant Judgement: Impaired Orientation: Person;Place;Time;Situation Obsessive Compulsive Thoughts/Behaviors: None  Cognitive Functioning Concentration: Normal Memory: Recent Intact;Remote Intact IQ: Average Insight: Fair Impulse Control: Poor Appetite: Fair Weight Loss: 0 Weight Gain: 0 Sleep: Decreased Total Hours of Sleep:  (3-4 hrs per night) Vegetative Symptoms: None  ADLScreening [facility]) Patient's cognitive ability adequate to safely complete daily activities?: Yes Patient able to express need for assistance with ADLs?: Yes Independently performs ADLs?: Yes (appropriate for developmental age)  Prior Inpatient Therapy Prior Inpatient Therapy: Yes Prior Therapy Dates: 2006 or 2007 Prior Therapy Facilty/Provider(s): [facility] Reason for Treatment: Detox  Prior Outpatient Therapy Prior Outpatient Therapy: No Prior Therapy Dates: na Prior Therapy Facilty/Provider(s): na Reason for Treatment: na  ADL Screening (condition at time of admission) Patient's  cognitive ability adequate to safely complete daily activities?: Yes Is the patient deaf or have difficulty hearing?: No Does the patient have difficulty seeing, even when wearing glasses/contacts?: No Does the patient have difficulty concentrating, remembering, or making decisions?: No Patient able to express need for assistance with ADLs?: Yes Does the patient have difficulty dressing or bathing?: No Independently performs ADLs?: Yes (appropriate for developmental age) Does the patient have difficulty walking or climbing stairs?: No Weakness of Legs: None Weakness of Arms/Hands: None  Home Assistive Devices/Equipment Home Assistive Devices/Equipment: None    Abuse/Neglect Assessment (Assessment to be complete while patient is alone) Physical Abuse: Denies Verbal Abuse: Denies Sexual Abuse: Denies Exploitation of patient/patient's resources: Denies Self-Neglect: Denies Values / Beliefs Cultural Requests During Hospitalization: None Spiritual Requests During Hospitalization: None   Advance Directives (For Healthcare) Advance Directive: Patient does not have advance directive;Patient would not like information    Additional Information 1:1 In Past 12 Months?: No CIRT Risk: No Elopement Risk: No Does patient have medical clearance?: Yes     Disposition:  Disposition Initial Assessment Completed for this Encounter: Yes Disposition of Patient: Other dispositions Other disposition(s): Other (Comment) (Pt D/C home by EDP, pt refused to wait for treatment)  On Site Evaluation by:   Reviewed with Physician:    Gerline Legacy, MS, LCASA Assessment Counselor  03/06/2013 12:27 AM

## 2013-03-26 ENCOUNTER — Emergency Department (HOSPITAL_COMMUNITY): Payer: Medicaid Other

## 2013-03-26 ENCOUNTER — Encounter (HOSPITAL_COMMUNITY): Payer: Self-pay | Admitting: Emergency Medicine

## 2013-03-26 ENCOUNTER — Emergency Department (HOSPITAL_COMMUNITY)
Admission: EM | Admit: 2013-03-26 | Discharge: 2013-03-26 | Disposition: A | Discharge status: Home / Self Care | Payer: Medicaid Other | Attending: Emergency Medicine | Admitting: Emergency Medicine

## 2013-03-26 DIAGNOSIS — S92919A Unspecified fracture of unspecified toe(s), initial encounter for closed fracture: Secondary | ICD-10-CM | POA: Insufficient documentation

## 2013-03-26 DIAGNOSIS — Z8659 Personal history of other mental and behavioral disorders: Secondary | ICD-10-CM | POA: Insufficient documentation

## 2013-03-26 DIAGNOSIS — F17200 Nicotine dependence, unspecified, uncomplicated: Secondary | ICD-10-CM | POA: Insufficient documentation

## 2013-03-26 DIAGNOSIS — G4700 Insomnia, unspecified: Secondary | ICD-10-CM | POA: Insufficient documentation

## 2013-03-26 DIAGNOSIS — F1310 Sedative, hypnotic or anxiolytic abuse, uncomplicated: Secondary | ICD-10-CM | POA: Insufficient documentation

## 2013-03-26 DIAGNOSIS — W219XXA Striking against or struck by unspecified sports equipment, initial encounter: Secondary | ICD-10-CM | POA: Insufficient documentation

## 2013-03-26 DIAGNOSIS — Y9389 Activity, other specified: Secondary | ICD-10-CM | POA: Insufficient documentation

## 2013-03-26 DIAGNOSIS — F1120 Opioid dependence, uncomplicated: Secondary | ICD-10-CM | POA: Insufficient documentation

## 2013-03-26 DIAGNOSIS — Y929 Unspecified place or not applicable: Secondary | ICD-10-CM | POA: Insufficient documentation

## 2013-03-26 DIAGNOSIS — F1410 Cocaine abuse, uncomplicated: Secondary | ICD-10-CM | POA: Insufficient documentation

## 2013-03-26 DIAGNOSIS — F1910 Other psychoactive substance abuse, uncomplicated: Secondary | ICD-10-CM

## 2013-03-26 DIAGNOSIS — Z79899 Other long term (current) drug therapy: Secondary | ICD-10-CM | POA: Insufficient documentation

## 2013-03-26 HISTORY — DX: Insomnia, unspecified: G47.00

## 2013-03-26 HISTORY — DX: Bipolar disorder, unspecified: F31.9

## 2013-03-26 LAB — COMPREHENSIVE METABOLIC PANEL
ALT: 21 U/L (ref 0–53)
AST: 25 U/L (ref 0–37)
Albumin: 4.5 g/dL (ref 3.5–5.2)
Alkaline Phosphatase: 74 U/L (ref 39–117)
BUN: 10 mg/dL (ref 6–23)
CO2: 24 mEq/L (ref 19–32)
Calcium: 9.7 mg/dL (ref 8.4–10.5)
Chloride: 100 mEq/L (ref 96–112)
Creatinine, Ser: 0.99 mg/dL (ref 0.50–1.35)
GFR calc Af Amer: >90 mL/min (ref >90)
GFR calc non Af Amer: >90 mL/min (ref >90)
Glucose, Bld: 104 mg/dL — ABNORMAL HIGH (ref 70–99)
Potassium: 3.7 mEq/L (ref 3.7–5.3)
Sodium: 139 mEq/L (ref 137–147)
Total Bilirubin: 0.5 mg/dL (ref 0.3–1.2)
Total Protein: 7.9 g/dL (ref 6.0–8.3)

## 2013-03-26 LAB — CBC
HCT: 41.6 % (ref 39.0–52.0)
Hemoglobin: 14.7 g/dL (ref 13.0–17.0)
MCH: 30.4 pg (ref 26.0–34.0)
MCHC: 35.3 g/dL (ref 30.0–36.0)
MCV: 86 fL (ref 78.0–100.0)
Platelets: 194 10*3/uL (ref 150–400)
RBC: 4.84 MIL/uL (ref 4.22–5.81)
RDW: 12.9 % (ref 11.5–15.5)
WBC: 12.5 10*3/uL — ABNORMAL HIGH (ref 4.0–10.5)

## 2013-03-26 LAB — URINALYSIS, ROUTINE W REFLEX MICROSCOPIC
Bilirubin Urine: NEGATIVE
Glucose, UA: NEGATIVE mg/dL
Hgb urine dipstick: NEGATIVE
Ketones, ur: NEGATIVE mg/dL
Leukocytes, UA: NEGATIVE
Nitrite: NEGATIVE
Protein, ur: NEGATIVE mg/dL
Specific Gravity, Urine: 1.021 (ref 1.005–1.030)
Urobilinogen, UA: 0.2 mg/dL (ref 0.0–1.0)
pH: 6 (ref 5.0–8.0)

## 2013-03-26 LAB — RAPID URINE DRUG SCREEN, HOSP PERFORMED
Amphetamines: NOT DETECTED
Barbiturates: NOT DETECTED
Benzodiazepines: NOT DETECTED
Cocaine: POSITIVE — AB
Opiates: NOT DETECTED
Tetrahydrocannabinol: POSITIVE — AB

## 2013-03-26 LAB — SALICYLATE LEVEL: Salicylate Lvl: <2 mg/dL — ABNORMAL LOW (ref 2.8–20.0)

## 2013-03-26 LAB — ACETAMINOPHEN LEVEL: Acetaminophen (Tylenol), Serum: <15 ug/mL (ref 10–30)

## 2013-03-26 LAB — ETHANOL: Alcohol, Ethyl (B): <11 mg/dL (ref 0–11)

## 2013-03-26 MED ORDER — ONDANSETRON 4 MG PO TBDP
8.0000 mg | ORAL_TABLET | Freq: Once | ORAL | Status: AC
  Administered 2013-03-26: 8 mg via ORAL
  Filled 2013-03-26: qty 2

## 2013-03-26 NOTE — ED Notes (Signed)
Spoke with sandy at [facility]. They do have male detox beds.

## 2013-03-26 NOTE — Discharge Instructions (Signed)
Polysubstance Abuse When people abuse more than one drug or type of drug it is called polysubstance or polydrug abuse. For example, many smokers also drink alcohol. This is one form of polydrug abuse. Polydrug abuse also refers to the use of a drug to counteract an unpleasant effect produced by another drug. It may also be used to help with withdrawal from another drug. People who take stimulants may become agitated. Sometimes this agitation is countered with a tranquilizer. This helps protect against the unpleasant side effects. Polydrug abuse also refers to the use of different drugs at the same time.  Anytime drug use is interfering with normal living activities, it has become abuse. This includes problems with family and friends. Psychological dependence has developed when your mind tells you that the drug is needed. This is usually followed by physical dependence which has developed when continuing increases of drug are required to get the same feeling or "high". This is known as addiction or chemical dependency. A person's risk is much higher if there is a history of chemical dependency in the family. SIGNS OF CHEMICAL DEPENDENCY  You have been told by friends or family that drugs have become a problem.  You fight when using drugs.  You are having blackouts (not remembering what you do while using).  You feel sick from using drugs but continue using.  You lie about use or amounts of drugs (chemicals) used.  You need chemicals to get you going.  You are suffering in work performance or in school because of drug use.  You get sick from use of drugs but continue to use anyway.  You need drugs to relate to people or feel comfortable in social situations.  You use drugs to forget problems. "Yes" answered to any of the above signs of chemical dependency indicates there are problems. The longer the use of drugs continues, the greater the problems will become. If there is a family history of  drug or alcohol use, it is best not to experiment with these drugs. Continual use leads to tolerance. After tolerance develops more of the drug is needed to get the same feeling. This is followed by addiction. With addiction, drugs become the most important part of life. It becomes more important to take drugs than participate in the other usual activities of life. This includes relating to friends and family. Addiction is followed by dependency. Dependency is a condition where drugs are now needed not just to get high, but to feel normal. Addiction cannot be cured but it can be stopped. This often requires outside help and the care of professionals. Treatment centers are listed in the yellow pages under: Cocaine, Narcotics, and Alcoholics Anonymous. Most hospitals and clinics can refer you to a specialized care center. Talk to your caregiver if you need help. Document Released: 09/07/2004 Document Revised: 04/10/2011 Document Reviewed: 01/16/2005 [facility] Patient Information 2014 [place], [place].  Toe Fracture Your caregiver has diagnosed you as having a fractured toe. A toe fracture is a break in the bone of a toe. "Buddy taping" is a way of splinting your broken toe, by taping the broken toe to the toe next to it. This "buddy taping" will keep the injured toe from moving beyond normal range of motion. Buddy taping also helps the toe heal in a more normal alignment. It may take 6 to 8 weeks for the toe injury to heal. HOME CARE INSTRUCTIONS   Leave your toes taped together for as long as directed by your  caregiver or until you see a doctor for a follow-up examination. You can change the tape after bathing. Always use a small piece of gauze or cotton between the toes when taping them together. This will help the skin stay dry and prevent infection.  Apply ice to the injury for 15-20 minutes each hour while awake for the first 2 days. Put the ice in a plastic bag and place a towel between the bag of ice  and your skin.  After the first 2 days, apply heat to the injured area. Use heat for the next 2 to 3 days. Place a heating pad on the foot or soak the foot in warm water as directed by your caregiver.  Keep your foot elevated as much as possible to lessen swelling.  Wear sturdy, supportive shoes. The shoes should not pinch the toes or fit tightly against the toes.  Your caregiver may prescribe a rigid shoe if your foot is very swollen.  Your may be given crutches if the pain is too great and it hurts too much to walk.  Only take over-the-counter or prescription medicines for pain, discomfort, or fever as directed by your caregiver.  If your caregiver has given you a follow-up appointment, it is very important to keep that appointment. Not keeping the appointment could result in a chronic or permanent injury, pain, and disability. If there is any problem keeping the appointment, you must call back to this facility for assistance. SEEK MEDICAL CARE IF:   You have increased pain or swelling, not relieved with medications.  The pain does not get better after 1 week.  Your injured toe is cold when the others are warm. SEEK IMMEDIATE MEDICAL CARE IF:   The toe becomes cold, numb, or white.  The toe becomes hot (inflamed) and red. Document Released: 01/14/2000 Document Revised: 04/10/2011 Document Reviewed: 09/02/2007 [facility] Patient Information 2014 [place], [place].   Emergency Department Resource Guide 1) Find a Doctor and Pay Out of Pocket Although you won't have to find out who is covered by your insurance plan, it is a good idea to ask around and get recommendations. You will then need to call the office and see if the doctor you have chosen will accept you as a new patient and what types of options they offer for patients who are self-pay. Some doctors offer discounts or will set up payment plans for their patients who do not have insurance, but you will need to ask so you aren't  surprised when you get to your appointment.  2) Contact Your Local Health Department Not all health departments have doctors that can see patients for sick visits, but many do, so it is worth a call to see if yours does. If you don't know where your local health department is, you can check in your phone book. The CDC also has a tool to help you locate your state's health department, and many state websites also have listings of all of their local health departments.  3) Find a Walk-in Clinic If your illness is not likely to be very severe or complicated, you may want to try a walk in clinic. These are popping up all over the country in pharmacies, drugstores, and shopping centers. They're usually staffed by nurse practitioners or physician assistants that have been trained to treat common illnesses and complaints. They're usually fairly quick and inexpensive. However, if you have serious medical issues or chronic medical problems, these are probably not your best option.  No Primary Care Doctor: - Call Health Connect at  [phone] - they can help you locate a primary care doctor that  accepts your insurance, provides certain services, etc. - Physician Referral Service- [phone]  Chronic Pain Problems: Organization         Address  Phone   Notes  [facility]  [phone] Patients need to be referred by their primary care doctor.   Medication Assistance: Organization         Address  Phone   Notes  [facility] [address] [place], [place] [address] [phone] --Must be a resident of [facility] -- Must have NO insurance coverage whatsoever (no Medicaid/ Medicare, etc.) -- The pt. MUST have a primary care doctor that directs their care regularly and follows them in the community   [facility]  [phone]   [employer]  [phone]    Agencies that provide inexpensive medical care: Organization          Address  Phone   Notes  [facility]  [phone]   [facility]    [phone]   [facility] [address] [place], [place] [address] [phone]   [facility] [address] [place] [place] [phone]   Planned Parenthood    [phone]   [facility]    [phone]   [facility]  [address], [place] Phone:  [phone], Fax:  [phone] Hours of Operation:  9 am - 6 pm, M-F.  Also accepts Medicaid/Medicare and self-pay.  [facility]  [address], [place] Phone: [phone], Fax: [phone]. Hours of Operation:  8:30 am - 5:30 pm, M-F.  Also accepts Medicaid and self-pay.  [facility] [place] [address], [place] Phone: [phone]   [facility] [address] Natasha [place], [place] [phone], Ext. [phone] Mondays & Thursdays: 7-9 AM.  First 15 patients are seen on a first come, first serve basis.    Medicaid-accepting [facility] Providers:  Organization         Address  Phone   Notes  [facility] [address], [place] [phone] Also accepts self-pay patients.  [facility] [address] Laurell Josephs [place], [place]  [phone]   [facility] [address], [place] [phone]   [facility] [address] [place] [phone]   Renaye Rakers [address], [place]   [phone] Only accepts [place] Access [place] patients after they have their name applied to their card.   Self-Pay (no insurance) in [facility]:  Organization         Address  Phone   Notes  Sickle Cell Patients, [facility] [address] [place], [place] [phone]   [facility] [address] [place], [place] [phone]     [facility] [place]  [address], [place] [phone]   [facility]/Dr. Osei-Bonsu  [address] [place] or [address], [place] [phone] Phone number for both [place] and [place] locations is the same.  [facility] [address], Ginette Otto [phone])  [phone]   [facility] [place] [address] [place] [address], [place] or [address] [phone]   [facility] [address] [place], [place] [phone], phone; [phone], fax Sees patients 1st and 3rd Saturday of every month.  Must not qualify for public or private insurance (i.e. Medicaid, Medicare, [place] Health Choice, Veterans' Benefits)  Household income should be no more than 200% of the poverty level The clinic cannot treat you if you are pregnant or think you are pregnant  Sexually transmitted diseases are not treated at the clinic.    Dental Care: Organization         Address  Phone  Notes  [facility] [address], [place] [phone] Accepts children up to age 34 who are enrolled in [place] or [place] Health Choice; pregnant women with a Medicaid card; and children who have applied for Medicaid or [place] Health Choice, but were declined, whose parents can pay a reduced fee at time of service.  [facility] [place]  [address], [place] [phone] Accepts children up to age 34 who are enrolled in [place] or [place] Health Choice; pregnant women with a Medicaid card; and children who have applied for Medicaid or [place] Health Choice, but were declined, whose parents can pay a reduced fee at time of service.  [facility]  [address], [place] [phone] Patients are seen by appointment only. Walk-ins are not accepted. [facility] will see patients 5518  years of age and older. Monday - Tuesday (8am-5pm) Most Wednesdays (8:30-5pm) $30 per visit, cash only  [facility]  [address], [facility] [place] [phone] Patients are seen by appointment only. Walk-ins are not accepted. [facility] will see patients 34 years of age and older. One Wednesday Evening (Monthly: Volunteer Based).  $30 per visit, cash only  [facility] [facility]  [phone]) [phone] for adults; Children under age 594, call Graduate Pediatric Dentistry at [phone]. Children aged 714-14, please call [phone] to request a pediatric application.  Dental services are provided in all areas of dental care including fillings, crowns and bridges, complete and partial dentures, implants, gum treatment, root canals, and extractions. Preventive care is also provided. Treatment is provided to both adults and children. Patients are selected via a lottery and there is often a waiting list.   [facility] [address], [place]  [phone] [email or web address]   [facility] [address], [place], [place] [phone], Ext. [phone] Second and Fourth Thursday of each month, opens at 6:30 AM; Clinic ends at 9 AM.  Patients are seen on a first-come first-served basis, and a limited number are seen during each clinic.   [facility]  [address], [place], [place] [phone]   Eligibility Requirements You must have lived in [place], [place], or [place] counties for at least the last three months.   You cannot be eligible for state or federal sponsored [place]healthcare insurance, including CIGNAVeterans Administration, [place]icaid, or [employer]icare.   You generally cannot be eligible for healthcare insurance through your employer.    How to apply: Eligibility screenings are held every Tuesday and Wednesday afternoon from 1:00 pm until 4:00 pm. You do not need an appointment for the interview!  [facility]  [address], [place], [place]  [phone]   [facility] Health Department  [phone]   [facility] Health Department  [phone]   [facility] Health Department  [phone]    Behavioral Health Resources in the Community: Intensive Outpatient Programs Organization         Address  Phone  Notes  [facility] [address] [place], [place] [phone]   [facility] [address] [place], [place] [phone]   [facility] [address], [place], [place]  [phone]   [facility] Mental Health [address],  [place], [place] [phone] or [phone]   Substance Abuse Resources Organization         Address  Phone  Notes  [facility] Drug [facility]  [phone]   [facility]  [phone]   [facility] [place]  [phone]   Floydene Flock  [phone]   Residential & Outpatient Substance Abuse Program  [phone]   Psychological Services Organization         Address  Phone  Notes  [facility] [place] [facility] Behavioral Health  [phone]   [facility] Services  [phone]   [facility] [address], [place] [phone] or [phone]    Mobile Crisis Teams Organization         Address  Phone  Notes  [facility], Mobile Crisis Care Unit  [phone]   [facility]  [address] [place], [place] [phone]   Doristine Locks [address] [place] [phone]    Self-Help/Support Groups Organization         Address  Phone             Notes  [facility] - variety of support groups  336- I7437963 Call for more information  Narcotics Anonymous (NA), [facility] [address], [employer] [place]  2 meetings at this location   Statistician         Address  Phone  Notes  [facility] [address],    [place]   [phone]   [facility]  [address] [place] [address], [place], [place] [phone]   [facility] [address] [place], [place] [phone] Admissions: 8am-3pm M-F  [facility] [address]    [place], [place] [phone]   [facility] [address] [place], [place], [place] [phone]   [facility] [address],  [place], [place] [phone]   [facility] - Intensive Outpatient [address] Laurell Josephs [address], [place], [place] [phone]   [facility] ([facility] [address] [place]  [place], [place] [phone] or [phone]   [facility] ([facility]) [address], [place], [place] [phone] Accepts Medicaid  [facility] [place] [address],  [place] [phone] Substance Abuse/Addiction Treatment   [facility] Organization         Address  Phone  Notes  [facility]  [phone]   Angie Fava, PhD [address] Ervin Knack [place], [place]   [phone] or [phone]   [facility] Behavioral   [address] [place], [place] [phone]   [facility] [address], [place], [place] [phone] Insurance/Medicaid/sponsorship through [employer] and Families [address]  [place], [place] [phone] [place], [place] [phone]    Dr. Adele Schilder  [phone]   [facility] Dept. 1) [address] [place] 2) [place] 3)  [address], [place] [phone]  [phone]   [place] [phone] or [phone] (After Hours)

## 2013-03-26 NOTE — ED Notes (Signed)
[facility] called for patient transport to [facility]

## 2013-03-26 NOTE — ED Provider Notes (Signed)
CSN: [id number]     Arrival date & time 03/26/13  1625 History   First MD Initiated Contact with Patient 03/26/13 1809     Chief Complaint  Patient presents with  . Medical Clearance     (Consider location/radiation/quality/duration/timing/severity/associated sxs/prior Treatment) HPI  33 year old male with polysubstance abuse. Patient abuses opiates, cocaine and benzodiazepines. Last used earlier today. Says he is just tired of using like to quit. No specific impetus for him in the emergency room today. Denies any suicidal or homicidal ideation. No hallucinations. Additionally, complaining of pain in his right big toe. Onset after taking a wall in anger. No other acute complaints.  Past Medical History  Diagnosis Date  . Anxiety   . Mental disorder   . Bipolar disorder   . Insomnia    History reviewed. No pertinent past surgical history. History reviewed. No pertinent family history. History  Substance Use Topics  . Smoking status: Current Every Day Smoker -- 1.50 packs/day    Types: Cigarettes  . Smokeless tobacco: Not on file  . Alcohol Use: No    Review of Systems  All systems reviewed and negative, other than as noted in HPI.   Allergies  Keflex  Home Medications   Current Outpatient Rx  Name  Route  Sig  Dispense  Refill  . Buprenorphine HCl-Naloxone HCl (SUBOXONE) 8-2 MG FILM   Sublingual   Place 1 Film under the tongue 4 (four) times daily.         Marland Kitchen zolpidem (AMBIEN) 5 MG tablet   Oral   Take 5 mg by mouth at bedtime.          BP 127/79  Pulse 88  Temp(Src) 100.6 F (38.1 C) (Oral)  Resp 18  Wt 155 lb (70.308 kg)  SpO2 99% Physical Exam  Nursing note and vitals reviewed. Constitutional: He is oriented to person, place, and time. He appears well-developed and well-nourished. No distress.  HENT:  Head: Normocephalic and atraumatic.  Eyes: Conjunctivae are normal. Right eye exhibits no discharge. Left eye exhibits no discharge.  Neck: Neck  supple.  Cardiovascular: Normal rate, regular rhythm and normal heart sounds.  Exam reveals no gallop and no friction rub.   No murmur heard. Pulmonary/Chest: Effort normal and breath sounds normal. No respiratory distress.  Abdominal: Soft. He exhibits no distension. There is no tenderness.  Musculoskeletal: He exhibits no edema and no tenderness.  Tenderness to palpation and pain on range of motion first MP joint right foot. Skin is intact. Neurovascular intact.  Neurological: He is alert and oriented to person, place, and time. No cranial nerve deficit. He exhibits normal muscle tone. Coordination normal.  Skin: Skin is warm and dry.  Psychiatric: He has a normal mood and affect. His behavior is normal. Thought content normal.    ED Course  Procedures (including critical care time) Labs Review Labs Reviewed  CBC - Abnormal; Notable for the following:    WBC 12.5 (*)    All other components within normal limits  COMPREHENSIVE METABOLIC PANEL - Abnormal; Notable for the following:    Glucose, Bld 104 (*)    All other components within normal limits  SALICYLATE LEVEL - Abnormal; Notable for the following:    Salicylate Lvl <2.0 (*)    All other components within normal limits  URINE RAPID DRUG SCREEN (HOSP PERFORMED) - Abnormal; Notable for the following:    Cocaine POSITIVE (*)    Tetrahydrocannabinol POSITIVE (*)    All other components within  normal limits  ACETAMINOPHEN LEVEL  ETHANOL  URINALYSIS, ROUTINE W REFLEX MICROSCOPIC   Imaging Review Dg Chest 2 View  03/26/2013   CLINICAL DATA:  Medical clearance.  Smoker.  EXAM: CHEST  2 VIEW  COMPARISON:  07/21/2007.  FINDINGS: The heart size and mediastinal contours are within normal limits. Both lungs are clear. The visualized skeletal structures are unremarkable.  IMPRESSION: No acute cardiopulmonary findings.  No change since prior study.   Electronically Signed   By: Loralie ChampagneMark  Gallerani M.D.   On: 03/26/2013 18:55   Dg Toe Great  Right  03/26/2013   CLINICAL DATA:  Injured great toe.  EXAM: RIGHT GREAT TOE  COMPARISON:  None.  FINDINGS: There is a minimally displaced fracture involving the base of the proximal phalanx medially. This does involve the peripheral aspect of the joint. The interphalangeal joint is normal.  IMPRESSION: Mildly comminuted and displaced fracture involving the medial corner of the proximal phalanx at the joint.   Electronically Signed   By: Loralie ChampagneMark  Gallerani M.D.   On: 03/26/2013 18:56    EKG Interpretation   None       MDM   Final diagnoses:  Polysubstance abuse  Closed fracture of toe    33 year old male requesting detox for polysubstance abuse. Nonpsychotic. No suicidal homicidal ideation. Found placement at [facility].  Closed fracture of right first toe. Postop shoe. When necessary pain medication. Outpatient ortho follow-up.    Raeford RazorStephen Saadiq Poche, MD 03/26/13 [phone]

## 2013-03-26 NOTE — ED Notes (Signed)
Patient being transported to [facility] by [facility]

## 2013-03-26 NOTE — ED Notes (Signed)
Pt states he wants detox from opiates and cocaine, last used 10 minutes ago. He states he takes suboxone for opiate dependency also. Rarely drinks alcohol and denies any other substance use. C/o nausea and mild abd pain right now. Denies SI/HI. He also wants to have R great toe examined while here because he kicked the wall with it and it hurts

## 2013-03-26 NOTE — ED Notes (Signed)
Security paged to wand patient 

## 2013-09-17 ENCOUNTER — Inpatient Hospital Stay (HOSPITAL_COMMUNITY)
Admission: AD | Admit: 2013-09-17 | Discharge: 2013-09-22 | DRG: 897 | Disposition: A | Discharge status: Home / Self Care | Payer: Medicaid Other | Source: Intra-hospital | Attending: Psychiatry | Admitting: Psychiatry

## 2013-09-17 ENCOUNTER — Encounter (HOSPITAL_COMMUNITY): Payer: Self-pay | Admitting: Behavioral Health

## 2013-09-17 ENCOUNTER — Encounter (HOSPITAL_COMMUNITY): Payer: Self-pay | Admitting: Emergency Medicine

## 2013-09-17 ENCOUNTER — Emergency Department (HOSPITAL_COMMUNITY)
Admission: EM | Admit: 2013-09-17 | Discharge: 2013-09-17 | Disposition: A | Discharge status: Home / Self Care | Payer: Medicaid Other | Attending: Emergency Medicine | Admitting: Emergency Medicine

## 2013-09-17 DIAGNOSIS — Z598 Other problems related to housing and economic circumstances: Secondary | ICD-10-CM

## 2013-09-17 DIAGNOSIS — F1410 Cocaine abuse, uncomplicated: Secondary | ICD-10-CM | POA: Diagnosis present

## 2013-09-17 DIAGNOSIS — F1994 Other psychoactive substance use, unspecified with psychoactive substance-induced mood disorder: Secondary | ICD-10-CM | POA: Diagnosis present

## 2013-09-17 DIAGNOSIS — F39 Unspecified mood [affective] disorder: Secondary | ICD-10-CM | POA: Diagnosis present

## 2013-09-17 DIAGNOSIS — F411 Generalized anxiety disorder: Secondary | ICD-10-CM | POA: Diagnosis present

## 2013-09-17 DIAGNOSIS — F1123 Opioid dependence with withdrawal: Secondary | ICD-10-CM

## 2013-09-17 DIAGNOSIS — Z79899 Other long term (current) drug therapy: Secondary | ICD-10-CM | POA: Insufficient documentation

## 2013-09-17 DIAGNOSIS — F319 Bipolar disorder, unspecified: Secondary | ICD-10-CM | POA: Insufficient documentation

## 2013-09-17 DIAGNOSIS — R45851 Suicidal ideations: Secondary | ICD-10-CM

## 2013-09-17 DIAGNOSIS — F332 Major depressive disorder, recurrent severe without psychotic features: Secondary | ICD-10-CM

## 2013-09-17 DIAGNOSIS — Z5989 Other problems related to housing and economic circumstances: Secondary | ICD-10-CM | POA: Diagnosis not present

## 2013-09-17 DIAGNOSIS — F1310 Sedative, hypnotic or anxiolytic abuse, uncomplicated: Secondary | ICD-10-CM | POA: Insufficient documentation

## 2013-09-17 DIAGNOSIS — F329 Major depressive disorder, single episode, unspecified: Secondary | ICD-10-CM | POA: Diagnosis present

## 2013-09-17 DIAGNOSIS — F17200 Nicotine dependence, unspecified, uncomplicated: Secondary | ICD-10-CM | POA: Diagnosis present

## 2013-09-17 DIAGNOSIS — F1120 Opioid dependence, uncomplicated: Principal | ICD-10-CM | POA: Diagnosis present

## 2013-09-17 DIAGNOSIS — F1910 Other psychoactive substance abuse, uncomplicated: Secondary | ICD-10-CM

## 2013-09-17 DIAGNOSIS — Z5987 Material hardship due to limited financial resources, not elsewhere classified: Secondary | ICD-10-CM

## 2013-09-17 DIAGNOSIS — G4700 Insomnia, unspecified: Secondary | ICD-10-CM | POA: Diagnosis present

## 2013-09-17 DIAGNOSIS — F1320 Sedative, hypnotic or anxiolytic dependence, uncomplicated: Secondary | ICD-10-CM

## 2013-09-17 LAB — CBC WITH DIFFERENTIAL/PLATELET
Basophils Absolute: 0 10*3/uL (ref 0.0–0.1)
Basophils Relative: 0 % (ref 0–1)
EOS ABS: 0.1 10*3/uL (ref 0.0–0.7)
Eosinophils Relative: 1 % (ref 0–5)
HCT: 42.8 % (ref 39.0–52.0)
Hemoglobin: 14.8 g/dL (ref 13.0–17.0)
LYMPHS ABS: 2 10*3/uL (ref 0.7–4.0)
LYMPHS PCT: 26 % (ref 12–46)
MCH: 29.7 pg (ref 26.0–34.0)
MCHC: 34.6 g/dL (ref 30.0–36.0)
MCV: 85.8 fL (ref 78.0–100.0)
Monocytes Absolute: 0.6 10*3/uL (ref 0.1–1.0)
Monocytes Relative: 8 % (ref 3–12)
NEUTROS PCT: 65 % (ref 43–77)
Neutro Abs: 4.9 10*3/uL (ref 1.7–7.7)
Platelets: 224 10*3/uL (ref 150–400)
RBC: 4.99 MIL/uL (ref 4.22–5.81)
RDW: 12.8 % (ref 11.5–15.5)
WBC: 7.6 10*3/uL (ref 4.0–10.5)

## 2013-09-17 LAB — HEPATIC FUNCTION PANEL
ALBUMIN: 4.2 g/dL (ref 3.5–5.2)
ALK PHOS: 74 U/L (ref 39–117)
ALT: 13 U/L (ref 0–53)
AST: 19 U/L (ref 0–37)
Bilirubin, Direct: <0.2 mg/dL (ref 0.0–0.3)
TOTAL PROTEIN: 7.3 g/dL (ref 6.0–8.3)
Total Bilirubin: 0.3 mg/dL (ref 0.3–1.2)

## 2013-09-17 LAB — RAPID URINE DRUG SCREEN, HOSP PERFORMED
Amphetamines: NOT DETECTED
BENZODIAZEPINES: POSITIVE — AB
Barbiturates: NOT DETECTED
Cocaine: POSITIVE — AB
Opiates: NOT DETECTED
Tetrahydrocannabinol: NOT DETECTED

## 2013-09-17 LAB — URINALYSIS, ROUTINE W REFLEX MICROSCOPIC
Bilirubin Urine: NEGATIVE
GLUCOSE, UA: NEGATIVE mg/dL
Hgb urine dipstick: NEGATIVE
Ketones, ur: NEGATIVE mg/dL
LEUKOCYTES UA: NEGATIVE
Nitrite: NEGATIVE
PH: 7 (ref 5.0–8.0)
Protein, ur: NEGATIVE mg/dL
SPECIFIC GRAVITY, URINE: 1.024 (ref 1.005–1.030)
Urobilinogen, UA: 0.2 mg/dL (ref 0.0–1.0)

## 2013-09-17 LAB — ACETAMINOPHEN LEVEL

## 2013-09-17 LAB — SALICYLATE LEVEL: Salicylate Lvl: <2 mg/dL — ABNORMAL LOW (ref 2.8–20.0)

## 2013-09-17 MED ORDER — NICOTINE 21 MG/24HR TD PT24
21.0000 mg | MEDICATED_PATCH | Freq: Every day | TRANSDERMAL | Status: DC
  Administered 2013-09-17: 21 mg via TRANSDERMAL
  Filled 2013-09-17: qty 1

## 2013-09-17 MED ORDER — HYDROXYZINE HCL 25 MG PO TABS
25.0000 mg | ORAL_TABLET | Freq: Four times a day (QID) | ORAL | Status: DC | PRN
  Administered 2013-09-21: 25 mg via ORAL
  Filled 2013-09-17: qty 1

## 2013-09-17 MED ORDER — MAGNESIUM HYDROXIDE 400 MG/5ML PO SUSP: 30.0000 mL | Freq: Every day | ORAL | Status: DC | PRN

## 2013-09-17 MED ORDER — CHLORDIAZEPOXIDE HCL 25 MG PO CAPS
25.0000 mg | ORAL_CAPSULE | Freq: Four times a day (QID) | ORAL | Status: DC | PRN
  Administered 2013-09-17: 25 mg via ORAL
  Filled 2013-09-17: qty 1

## 2013-09-17 MED ORDER — ALUM & MAG HYDROXIDE-SIMETH 200-200-20 MG/5ML PO SUSP: 30.0000 mL | ORAL | Status: DC | PRN

## 2013-09-17 MED ORDER — CLONIDINE HCL 0.1 MG PO TABS
0.1000 mg | ORAL_TABLET | Freq: Every day | ORAL | Status: DC
  Filled 2013-09-17: qty 1

## 2013-09-17 MED ORDER — SERTRALINE HCL 50 MG PO TABS: 100.0000 mg | ORAL_TABLET | Freq: Two times a day (BID) | ORAL | Status: DC

## 2013-09-17 MED ORDER — LOPERAMIDE HCL 2 MG PO CAPS: 2.0000 mg | ORAL_CAPSULE | ORAL | Status: DC | PRN

## 2013-09-17 MED ORDER — ONDANSETRON 4 MG PO TBDP: 4.0000 mg | ORAL_TABLET | Freq: Four times a day (QID) | ORAL | Status: DC | PRN

## 2013-09-17 MED ORDER — NAPROXEN 500 MG PO TABS
500.0000 mg | ORAL_TABLET | Freq: Two times a day (BID) | ORAL | Status: DC | PRN
  Administered 2013-09-18 – 2013-09-21 (×3): 500 mg via ORAL
  Filled 2013-09-17 (×3): qty 1

## 2013-09-17 MED ORDER — ACETAMINOPHEN 325 MG PO TABS: 650.0000 mg | ORAL_TABLET | Freq: Four times a day (QID) | ORAL | Status: DC | PRN

## 2013-09-17 MED ORDER — METHOCARBAMOL 500 MG PO TABS
500.0000 mg | ORAL_TABLET | Freq: Three times a day (TID) | ORAL | Status: DC | PRN
  Administered 2013-09-18 – 2013-09-21 (×4): 500 mg via ORAL
  Filled 2013-09-17 (×5): qty 1

## 2013-09-17 MED ORDER — DICYCLOMINE HCL 20 MG PO TABS: 20.0000 mg | ORAL_TABLET | Freq: Four times a day (QID) | ORAL | Status: DC | PRN

## 2013-09-17 MED ORDER — TRAZODONE HCL 100 MG PO TABS
100.0000 mg | ORAL_TABLET | Freq: Every day | ORAL | Status: DC
  Administered 2013-09-17 – 2013-09-21 (×4): 100 mg via ORAL
  Filled 2013-09-17 (×7): qty 1

## 2013-09-17 MED ORDER — TRAZODONE HCL 50 MG PO TABS: 100.0000 mg | ORAL_TABLET | Freq: Every day | ORAL | Status: DC

## 2013-09-17 MED ORDER — CLONIDINE HCL 0.1 MG PO TABS
0.1000 mg | ORAL_TABLET | ORAL | Status: DC
  Administered 2013-09-21: 0.1 mg via ORAL
  Filled 2013-09-17 (×4): qty 1

## 2013-09-17 MED ORDER — CLONIDINE HCL 0.1 MG PO TABS
0.1000 mg | ORAL_TABLET | Freq: Four times a day (QID) | ORAL | Status: AC
  Administered 2013-09-17 – 2013-09-19 (×6): 0.1 mg via ORAL
  Filled 2013-09-17 (×12): qty 1

## 2013-09-17 MED ORDER — SERTRALINE HCL 100 MG PO TABS
100.0000 mg | ORAL_TABLET | Freq: Two times a day (BID) | ORAL | Status: DC
  Administered 2013-09-17 – 2013-09-22 (×6): 100 mg via ORAL
  Filled 2013-09-17 (×3): qty 1
  Filled 2013-09-17: qty 2
  Filled 2013-09-17 (×9): qty 1
  Filled 2013-09-17: qty 2
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
## 2013-09-17 NOTE — ED Notes (Signed)
Regular diet tray w/ no sharps ordered for pt
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
## 2013-09-17 NOTE — ED Provider Notes (Signed)
[facility] Accepting physician is Dr. Kelli Hopeobos  Roxanne Panek T Dorrien Grunder, MD 09/17/13 [phone]

## 2013-09-17 NOTE — Progress Notes (Signed)
Patient has been [facility] Bed 500[facility]2.
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
## 2013-09-18 ENCOUNTER — Encounter (HOSPITAL_COMMUNITY): Payer: Self-pay | Admitting: Behavioral Health

## 2013-09-18 DIAGNOSIS — F1410 Cocaine abuse, uncomplicated: Secondary | ICD-10-CM

## 2013-09-18 DIAGNOSIS — F1320 Sedative, hypnotic or anxiolytic dependence, uncomplicated: Secondary | ICD-10-CM | POA: Diagnosis present

## 2013-09-18 DIAGNOSIS — F329 Major depressive disorder, single episode, unspecified: Secondary | ICD-10-CM | POA: Diagnosis present

## 2013-09-18 DIAGNOSIS — F322 Major depressive disorder, single episode, severe without psychotic features: Secondary | ICD-10-CM

## 2013-09-18 DIAGNOSIS — F1120 Opioid dependence, uncomplicated: Principal | ICD-10-CM

## 2013-09-18 MED ORDER — LORAZEPAM 1 MG PO TABS
1.0000 mg | ORAL_TABLET | Freq: Two times a day (BID) | ORAL | Status: DC
Start: 2013-09-20 — End: 2013-09-20

## 2013-09-18 MED ORDER — ENSURE COMPLETE PO LIQD
237.0000 mL | Freq: Two times a day (BID) | ORAL | Status: DC
  Administered 2013-09-18: 237 mL via ORAL

## 2013-09-18 MED ORDER — NICOTINE 21 MG/24HR TD PT24
21.0000 mg | MEDICATED_PATCH | Freq: Every day | TRANSDERMAL | Status: DC
  Administered 2013-09-19: 21 mg via TRANSDERMAL
  Filled 2013-09-18 (×5): qty 1

## 2013-09-18 MED ORDER — LORAZEPAM 1 MG PO TABS
1.0000 mg | ORAL_TABLET | Freq: Three times a day (TID) | ORAL | Status: DC
  Administered 2013-09-19: 1 mg via ORAL
  Filled 2013-09-18 (×2): qty 1

## 2013-09-18 MED ORDER — LORAZEPAM 1 MG PO TABS: 1.0000 mg | ORAL_TABLET | Freq: Every day | ORAL | Status: DC

## 2013-09-18 MED ORDER — ADULT MULTIVITAMIN W/MINERALS CH
1.0000 | ORAL_TABLET | Freq: Every day | ORAL | Status: DC
  Administered 2013-09-18 – 2013-09-22 (×3): 1 via ORAL
  Filled 2013-09-18 (×6): qty 1

## 2013-09-18 MED ORDER — LORAZEPAM 1 MG PO TABS
1.0000 mg | ORAL_TABLET | Freq: Four times a day (QID) | ORAL | Status: DC | PRN
  Administered 2013-09-19: 1 mg via ORAL
  Filled 2013-09-18: qty 1

## 2013-09-18 MED ORDER — LORAZEPAM 1 MG PO TABS
1.0000 mg | ORAL_TABLET | Freq: Four times a day (QID) | ORAL | Status: AC
  Administered 2013-09-18 – 2013-09-19 (×3): 1 mg via ORAL
  Filled 2013-09-18 (×3): qty 1
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
## 2013-09-18 NOTE — Progress Notes (Signed)
Patient ID: Erik RidgeJohn N Davila, male   DOB: 1980/02/19, 33 y.o.   MRN: [id number] He has been in bed except to get his noon medication and lunch. He has requested and received prn medication for withdrawals. Other wise he has been in bed with his head covered up. Has been cooperative and calm.

## 2013-09-18 NOTE — BHH Group Notes (Signed)
0900 nursing orientation group  The focus of this group is to educate the patient on the purpose and policies of crisis stabilization and provide a format to answer questions about their admission.  The group details unit policies and expectations of patients while admitted.  Pt did not attend he was feeling "too sick" to participant.

## 2013-09-18 NOTE — Progress Notes (Signed)
Adult Psychoeducational Group Note  Date:  09/18/2013 Time:  10:00am Group Topic/Focus:  Making Healthy Choices:   The focus of this group is to help patients identify negative/unhealthy choices they were using prior to admission and identify positive/healthier coping strategies to replace them upon discharge.  Participation Level:  Did Not Attend  Participation Quality:    Affect:   Cognitive:    Insight:  Engagement in Group:   Modes of Intervention:   Additional Comments:  Did not attend  Shelly BombardGarner, Rachelann Enloe D 09/18/2013, 10:19 AM
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
## 2013-09-18 NOTE — Clinical Social Work Note (Signed)
CSW attempted to provide SPE to wife Missy SabinsShelly Okeefe [phone] but no answer. CSW will attempt to contact wife at a later time.   Samuella BruinKristin Sanvi Ehler, MSW, [employer]CSWA Clinical Social Worker [facility] [phone]
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
## 2013-09-18 NOTE — Progress Notes (Signed)
NUTRITION ASSESSMENT  Pt identified as at risk on the Malnutrition Screen Tool  Pt meets criteria for severe MALNUTRITION in the context of social/environmental circumstances as evidenced by <50% estimated energy intake with 12% weight loss in the past 2 weeks per pt report.  INTERVENTION: 1. Educated patient on the importance of nutrition and encouraged intake of food and beverages. 2. Supplements: Ensure Complete BID  NUTRITION DIAGNOSIS: Unintentional weight loss related to sub-optimal intake as evidenced by pt report.   Goal: Pt to meet >/= 90% of their estimated nutrition needs.  Monitor:  PO intake  Assessment:  Admitted with suicidal ideation and substance of abuse of cocaine and xanax.   - Met with pt who reports he was eating 1 meal/day before bed of whatever he wanted - Reports 20 pound unintended weight loss in the past 2 weeks - Agreeable to getting Ensure Complete   33 y.o. male  Height: Ht Readings from Last 1 Encounters:  09/17/13 6' (1.829 m)    Weight: Wt Readings from Last 1 Encounters:  09/17/13 150 lb (68.04 kg)    Weight Hx: Wt Readings from Last 10 Encounters:  09/17/13 150 lb (68.04 kg)  03/26/13 155 lb (70.308 kg)    BMI:  Body mass index is 20.34 kg/(m^2). Pt meets criteria for normal weight based on current BMI.  Estimated Nutritional Needs: Kcal: 25-30 kcal/kg Protein: > 1 gram protein/kg Fluid: 1 ml/kcal  Diet Order: General Pt is also offered choice of unit snacks mid-morning and mid-afternoon.  Pt is eating as desired.   Lab results and medications reviewed.   Carlis Stable MS, Lucas, LDN [phone] Pager [phone] Weekend/After Hours Pager

## 2013-09-18 NOTE — Clinical Social Work Note (Signed)
[facility]. Awaiting return call.   Samuella BruinKristin Placido Hangartner, MSW, [employer]CSWA Clinical Social Worker [facility] [phone]

## 2013-09-18 NOTE — Progress Notes (Signed)
Adult Psychoeducational Group Note  Date:  09/18/2013 Time:  10:17 PM  Group Topic/Focus:  Wrap-Up Group:   The focus of this group is to help patients review their daily goal of treatment and discuss progress on daily workbooks.  Participation Level:  Did Not Attend  Participation Quality:  Drowsy  Affect:  Flat  Cognitive:  Lacking  Insight: Limited and None  Engagement in Group:  None  Modes of Intervention:  none  Additional Comments:  None Pt did not come to group   Candance Bohlman R 09/18/2013, 10:17 PM

## 2013-09-18 NOTE — [facility] Group Notes (Signed)
[facility] LCSW Group Therapy 09/18/2013 1:15 PM  Type of Therapy: Group Therapy  Participation Level: Active  Participation Quality: Attentive, Sharing and Supportive  Affect: Depressed and Flat  Cognitive: Alert and Oriented  Insight: Developing/Improving and Engaged  Engagement in Therapy: Developing/Improving and Engaged Modes of Intervention: Clarification, Confrontation, Discussion, Education, Exploration, Limit-setting, Orientation, Problem-solving, Rapport Building, Dance movement psychotherapisteality Testing, Socialization and Support  Summary of Progress/Problems: Patient actively listened to Mental [employer] Association guest speaker.   Samuella BruinKristin Lucas Exline, MSW, [employer]CSWA Clinical Social Worker [facility] [phone]
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
## 2013-09-19 DIAGNOSIS — F1994 Other psychoactive substance use, unspecified with psychoactive substance-induced mood disorder: Secondary | ICD-10-CM

## 2013-09-19 MED ORDER — GABAPENTIN 100 MG PO CAPS
100.0000 mg | ORAL_CAPSULE | Freq: Three times a day (TID) | ORAL | Status: DC
  Administered 2013-09-19 – 2013-09-22 (×2): 100 mg via ORAL
  Filled 2013-09-19 (×12): qty 1

## 2013-09-19 NOTE — Progress Notes (Signed)
[name] was up, for a few minutes, when he came to the med window ( after breakfast) and took his morning  medicines. He then went right back to his room, got in the bed and has been there ever since.   A He refused to get up and go to gym ( for lunch) and has been sleeping since. HE would not get OOB for his lunchtime meds and they were not given.   R This writer has requested twice he complete his morning assessment and he has not doen so as of yet. He does Community education officercontract with this Clinical research associatewriter for safety.   R Safety is in place and poc contd.

## 2013-09-19 NOTE — Progress Notes (Signed)
[facility] Progress Note  09/19/2013 5:46 PM Erik Davila  MRN:  [id number] Subjective:  Erik RuizJohn continues to be detox. He admits to feeling more aches and pains. Still not able to have a full meal. He states he really wants to do this as his ability to have his family with him depends on it. He states he had been cutting on the amount of Xanax he was taking before he was here but that he was still taking quite a bit Diagnosis:   DSM5: Substance/Addictive Disorders:  Opioid Disorder - Severe (304.00), Cocaine related disorder, Benzodiazepine related disorder Depressive Disorders:  Major Depressive Disorder - Moderate (296.22) Total Time spent with patient: 30 minutes  Axis I: Substance Induced Mood Disorder  ADL's:  Intact  Sleep: Fair  Appetite:  Poor  Psychiatric Specialty Exam: Physical Exam  Review of Systems  Constitutional: Positive for malaise/fatigue.  HENT: Negative.   Eyes: Negative.   Respiratory: Negative.   Cardiovascular: Negative.   Gastrointestinal: Negative.   Genitourinary: Negative.   Musculoskeletal: Positive for joint pain and myalgias.  Skin: Negative.   Neurological: Positive for dizziness and weakness.  Endo/Heme/Allergies: Negative.   Psychiatric/Behavioral: Positive for depression and substance abuse. The patient is nervous/anxious.     Blood pressure 95/60, pulse 93, temperature 98.2 F (36.8 C), temperature source Oral, resp. rate 16, height 6' (1.829 m), weight 68.04 kg (150 lb), SpO2 95.00%.Body mass index is 20.34 kg/(m^2).  General Appearance: Fairly Groomed  Patent attorneyye Contact::  Fair  Speech:  Clear and Coherent  Volume:  Decreased  Mood:  Anxious and in pain  Affect:  anxious, worried in pain  Thought Process:  Coherent and Goal Directed  Orientation:  Full (Time, Place, and Person)  Thought Content:  symptoms worries concerns wanting to go to rehab  Suicidal Thoughts:  No  Homicidal Thoughts:  No  Memory:  Immediate;   Fair Recent;    Fair Remote;   Fair  Judgement:  Fair  Insight:  Present  Psychomotor Activity:  Restlessness  Concentration:  Fair  Recall:  FiservFair  Fund of Knowledge:NA  Language: Fair  Akathisia:  No  Handed:    AIMS (if indicated):     Assets:  Desire for Improvement Housing Social Support  Sleep:  Number of Hours: 6.25   Musculoskeletal: Strength & Muscle Tone: within normal limits Gait & Station: normal Patient leans: N/A  Current Medications: Current Facility-Administered Medications  Medication Dose Route Frequency Provider Last Rate Last Dose  . acetaminophen (TYLENOL) tablet 650 mg  650 mg Oral Q6H PRN Kerry HoughSpencer E Simon, PA-C      . alum & mag hydroxide-simeth (MAALOX/MYLANTA) 200-200-20 MG/5ML suspension 30 mL  30 mL Oral Q4H PRN Kerry HoughSpencer E Simon, PA-C      . cloNIDine (CATAPRES) tablet 0.1 mg  0.1 mg Oral QID Kerry HoughSpencer E Simon, PA-C   0.1 mg at 09/19/13 1717   Followed by  . [START ON 09/20/2013] cloNIDine (CATAPRES) tablet 0.1 mg  0.1 mg Oral BH-qamhs Spencer E Simon, PA-C       Followed by  . [START ON 09/23/2013] cloNIDine (CATAPRES) tablet 0.1 mg  0.1 mg Oral QAC breakfast Kerry HoughSpencer E Simon, PA-C      . dicyclomine (BENTYL) tablet 20 mg  20 mg Oral Q6H PRN Kerry HoughSpencer E Simon, PA-C      . feeding supplement (ENSURE COMPLETE) (ENSURE COMPLETE) liquid 237 mL  237 mL Oral BID BM Tenny CrawHeather S Winkler, RD   237 mL at 09/18/13  1418  . hydrOXYzine (ATARAX/VISTARIL) tablet 25 mg  25 mg Oral Q6H PRN Kerry Hough, PA-C      . loperamide (IMODIUM) capsule 2-4 mg  2-4 mg Oral PRN Kerry Hough, PA-C      . LORazepam (ATIVAN) tablet 1 mg  1 mg Oral Q6H PRN Rachael Fee, MD      . LORazepam (ATIVAN) tablet 1 mg  1 mg Oral TID Rachael Fee, MD   1 mg at 09/19/13 1719   Followed by  . [START ON 09/20/2013] LORazepam (ATIVAN) tablet 1 mg  1 mg Oral BID Rachael Fee, MD       Followed by  . [START ON 09/22/2013] LORazepam (ATIVAN) tablet 1 mg  1 mg Oral Daily Rachael Fee, MD      . magnesium hydroxide  (MILK OF MAGNESIA) suspension 30 mL  30 mL Oral Daily PRN Kerry Hough, PA-C      . methocarbamol (ROBAXIN) tablet 500 mg  500 mg Oral Q8H PRN Kerry Hough, PA-C   500 mg at 09/19/13 1546  . multivitamin with minerals tablet 1 tablet  1 tablet Oral Daily Rachael Fee, MD   1 tablet at 09/19/13 0857  . naproxen (NAPROSYN) tablet 500 mg  500 mg Oral BID PRN Kerry Hough, PA-C   500 mg at 09/18/13 0911  . nicotine (NICODERM CQ - dosed in mg/24 hours) patch 21 mg  21 mg Transdermal Q0600 Nehemiah Massed, MD   21 mg at 09/19/13 0856  . ondansetron (ZOFRAN-ODT) disintegrating tablet 4 mg  4 mg Oral Q6H PRN Kerry Hough, PA-C      . sertraline (ZOLOFT) tablet 100 mg  100 mg Oral BID Kerry Hough, PA-C   100 mg at 09/19/13 1715  . traZODone (DESYREL) tablet 100 mg  100 mg Oral QHS Kerry Hough, PA-C   100 mg at 09/18/13 2116    Lab Results: No results found for this or any previous visit (from the past 48 hour(s)).  Physical Findings: AIMS: Facial and Oral Movements Muscles of Facial Expression: None, normal Lips and Perioral Area: None, normal Jaw: None, normal Tongue: None, normal,Extremity Movements Upper (arms, wrists, hands, fingers): None, normal Lower (legs, knees, ankles, toes): None, normal, Trunk Movements Neck, shoulders, hips: None, normal, Overall Severity Severity of abnormal movements (highest score from questions above): None, normal Incapacitation due to abnormal movements: None, normal Patient's awareness of abnormal movements (rate only patient's report): No Awareness, Dental Status Current problems with teeth and/or dentures?: No Does patient usually wear dentures?: No  CIWA:  CIWA-Ar Total: 2 COWS:  COWS Total Score: 5  Treatment Plan Summary: Daily contact with patient to assess and evaluate symptoms and progress in treatment Medication management  Plan: Supportive approach/coping skills/relapse prevention            Continue detox           Add  Neurontin 100 mg TID           Explore rehab options  Medical Decision Making Problem Points:  Review of psycho-social stressors (1) Data Points:  Review of new medications or change in dosage (2)  I certify that inpatient services furnished can reasonably be expected to improve the patient's condition.   Connie Hilgert A 09/19/2013, 5:46 PM

## 2013-09-19 NOTE — Progress Notes (Signed)
D. Pt has been in room and in bed much of the evening. Pt states that he does not feel well and is unable to participate due to on-going withdrawal issues. Pt has eaten and has taken fluids that were provided for him. Pt spoke about how he is wanting to get into some type of treatment facility from here. BP was low this evening and clonidine was held. Pt does not appear to be symptomatic due to the low BP and reports no ill effects.  A. Support and encouragement provided, medication education given. R. Pt verbalized understanding, safety maintained.

## 2013-09-19 NOTE — [facility] Group Notes (Signed)
[facility]  09/19/2013  8:45 AM  Participation Quality: Did Not Attend.  Samuella BruinKristin Navpreet Szczygiel, MSW, [employer]LCSWA Clinical Social Worker [facility] [phone]

## 2013-09-19 NOTE — Progress Notes (Signed)
Pt has rested well tonight. Observed with even and unlabored RR. No acute distress and no complaints voiced. Level III obs in place for safety and pt remains safe. Lawrence MarseillesFriedman, Glendon Dunwoody Eakes

## 2013-09-19 NOTE — [facility] Suicide Risk Assessment (Signed)
[facility] INPATIENT:  Family/Significant Other Suicide Prevention Education  Suicide Prevention Education:  Education Completed; Wife Bella KennedyShelley Enderle [phone],  (name of family member/significant other) has been identified by the patient as the family member/significant other with whom the patient will be residing, and identified as the person(s) who will aid the patient in the event of a mental health crisis (suicidal ideations/suicide attempt).  With written consent from the patient, the family member/significant other has been provided the following suicide prevention education, prior to the and/or following the discharge of the patient.  The suicide prevention education provided includes the following:  Suicide risk factors  Suicide prevention and interventions  National Suicide Hotline telephone number  [facility] assessment telephone number  [facility] Assistance 911  [facility] and/or Residential Mobile Crisis Unit telephone number  Request made of family/significant other to:  Remove weapons (e.g., guns, rifles, knives), all items previously/currently identified as safety concern.    Remove drugs/medications (over-the-counter, prescriptions, illicit drugs), all items previously/currently identified as a safety concern.  The family member/significant other verbalizes understanding of the suicide prevention education information provided.  The family member/significant other agrees to remove the items of safety concern listed above.  Kristina Mcnorton, [place] CarboKristin L 09/19/2013, 1:14 PM

## 2013-09-19 NOTE — [facility] Group Notes (Signed)
[facility]  09/19/2013 3:38 PM  Type of Therapy:  Group Therapy  Participation Level:  Did Not Attend  Wynn BankerHodnett, Natashia Roseman Hairston 09/19/2013, 3:38 PM

## 2013-09-19 NOTE — Tx Team (Signed)
Interdisciplinary Treatment Plan Update (Adult) Date: 09/19/2013   Time Reviewed: 9:30 AM  Progress in Treatment: Attending groups: No Participating in groups: No Taking medication as prescribed: Yes Tolerating medication: Yes Family/Significant other contact made: CSW continuing to assess Patient understands diagnosis: Yes Discussing patient identified problems/goals with staff: Yes Medical problems stabilized or resolved: Yes Denies suicidal/homicidal ideation: Unknown, CSW continuing to assess Issues/concerns per patient self-inventory: Yes Other:  New problem(s) identified: N/A  Discharge Plan or Barriers: [facility] screening on Tuesday 09/23/13.  Reason for Continuation of Hospitalization:  Depression Anxiety Medication Stabilization   Comments: N/A  Estimated length of stay: 3-5 days  For review of initial/current patient goals, please see plan of care.  Attendees: Patient:    Family:    Physician: Dr. Jama Flavorsobos; Dr. Dub MikesLugo 09/19/2013 9:30 AM  Nursing: Harold Barbanonecia Byrd; Alexia FreestonePatty D., RN 09/19/2013 9:30 AM  Clinical Social Worker: Samuella BruinKristin Beuford Garcilazo,  LCSWA 09/19/2013 9:30 AM  Other: Juline PatchQuylle Hodnett, LCSW 09/19/2013 9:30 AM  Other: Leisa LenzValerie Enoch, Vesta MixerMonarch Liaison 09/19/2013 9:30 AM   09/19/2013 9:30 AM  Other:    Other:    Other:    Other:    Other:    Other:      Scribe for Treatment Team:  Samuella BruinKristin Vira Chaplin, MSW, [employer]LCSWA [phone]

## 2013-09-20 MED ORDER — LORAZEPAM 1 MG PO TABS: 1.0000 mg | ORAL_TABLET | Freq: Two times a day (BID) | ORAL | Status: AC

## 2013-09-20 MED ORDER — ZIPRASIDONE MESYLATE 20 MG IM SOLR
20.0000 mg | Freq: Once | INTRAMUSCULAR | Status: DC
  Filled 2013-09-20: qty 20

## 2013-09-20 MED ORDER — DIPHENHYDRAMINE HCL 50 MG/ML IJ SOLN
INTRAMUSCULAR | Status: AC
  Administered 2013-09-20: 50 mg via INTRAMUSCULAR
  Filled 2013-09-20: qty 1

## 2013-09-20 MED ORDER — DIPHENHYDRAMINE HCL 50 MG/ML IJ SOLN
INTRAMUSCULAR | Status: AC
Start: 2013-09-20 — End: 2013-09-20
  Filled 2013-09-20: qty 1

## 2013-09-20 MED ORDER — CHLORPROMAZINE HCL 25 MG/ML IJ SOLN
INTRAMUSCULAR | Status: AC
  Filled 2013-09-20: qty 1

## 2013-09-20 MED ORDER — LORAZEPAM 2 MG/ML IJ SOLN
INTRAMUSCULAR | Status: AC
  Administered 2013-09-20: 2 mg
  Filled 2013-09-20: qty 1

## 2013-09-20 MED ORDER — DIPHENHYDRAMINE HCL 50 MG/ML IJ SOLN
50.0000 mg | Freq: Once | INTRAMUSCULAR | Status: AC
  Administered 2013-09-20: 50 mg via INTRAMUSCULAR
  Filled 2013-09-20: qty 1

## 2013-09-20 MED ORDER — LORAZEPAM 1 MG PO TABS
1.0000 mg | ORAL_TABLET | Freq: Four times a day (QID) | ORAL | Status: AC | PRN
Start: 2013-09-20 — End: 2013-09-21

## 2013-09-20 MED ORDER — OLANZAPINE 5 MG PO TBDP: 5.0000 mg | ORAL_TABLET | Freq: Two times a day (BID) | ORAL | Status: DC | PRN

## 2013-09-20 MED ORDER — CHLORPROMAZINE HCL 25 MG/ML IJ SOLN
100.0000 mg | Freq: Once | INTRAMUSCULAR | Status: AC
  Administered 2013-09-20: 100 mg via INTRAMUSCULAR
  Filled 2013-09-20: qty 4

## 2013-09-20 MED ORDER — LORAZEPAM 2 MG/ML IJ SOLN: 2.0000 mg | Freq: Once | INTRAMUSCULAR | Status: AC

## 2013-09-20 MED ORDER — DIPHENHYDRAMINE HCL 50 MG PO CAPS
50.0000 mg | ORAL_CAPSULE | Freq: Once | ORAL | Status: AC
  Filled 2013-09-20: qty 1

## 2013-09-20 MED ORDER — LORAZEPAM 1 MG PO TABS
1.0000 mg | ORAL_TABLET | Freq: Every day | ORAL | Status: DC
  Filled 2013-09-20: qty 1

## 2013-09-20 MED ORDER — LORAZEPAM 1 MG PO TABS: 1.0000 mg | ORAL_TABLET | Freq: Three times a day (TID) | ORAL | Status: AC

## 2013-09-20 MED ORDER — ZIPRASIDONE MESYLATE 20 MG IM SOLR
INTRAMUSCULAR | Status: AC
  Administered 2013-09-20: 17:00:00
  Filled 2013-09-20: qty 20

## 2013-09-20 NOTE — Progress Notes (Signed)
Pt resting in bed with eyes closed. No distress noted. Will continue to monitor closely.  

## 2013-09-20 NOTE — Progress Notes (Signed)
Chart reviewed. Case discussed with NP. Agree with above assessment and plan.  Zandrea Kenealy, MD  

## 2013-09-20 NOTE — Progress Notes (Signed)
He is agitated, he is unreasonable, saying things like " I'm going to get out of here...one way or the other..." He is observed going from phone in hall to end of hallway, across aisle from the big nurses station, with is arms crossed across his chest. HE refuses to make eye contact with this nurse. He is observed speaking to dr in hall and he says " man...ibuprofen said I want to go NOW. What kiind of man ar eyou that you wouldn't even come talk to me...man to man. You had to send one of your fun dummies to come talk to me. That is messed up. One way or not. I'm getting out of here. ". Pt is assisted to sign 72 hr request for DC, per his request. He cont to refuse zydis.

## 2013-09-20 NOTE — Progress Notes (Signed)
Psychoeducational Group Note  Date:  09/20/2013 Time:  2342  Group Topic/Focus:  Wrap-Up Group:   The focus of this group is to help patients review their daily goal of treatment and discuss progress on daily workbooks.  Participation Level: Did Not Attend  Participation Quality:  Not Applicable  Affect:  Not Applicable  Cognitive:  Not Applicable  Insight:  Not Applicable  Engagement in Group: Not Applicable  Additional Comments:  The patient did not attend group since he was asleep in the quiet room.   Hazle CocaGOODMAN, Zayonna Ayuso S 09/20/2013, 11:42 PM

## 2013-09-20 NOTE — [facility] Group Notes (Signed)
[facility] Group Notes: (Clinical Social Work)   09/20/2013      Type of Therapy:  Group Therapy   Participation Level:  Did Not Attend    Ambrose Mantle, LCSW 09/20/2013, 3:24 PM
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
## 2013-09-20 NOTE — Progress Notes (Signed)
Patient ID: Erik Davila, male   DOB: Oct 15, 1980, 33 y.o.   MRN: [id number] [facility] MD Progress Note  09/20/2013 10:29 AM Erik Davila  MRN:  [id number] Subjective:   Patient states "I am still having body aches. But the withdrawal symptoms have gotten better. My depression is better. I am hopeful about moving to [place] with my family after I leave. I have an appointment with [facility] next week. I really want to leave today to spend time with my family before I go."  Objective:  Patient is seen and chart is reviewed. The patient continues to actively detox on both the ativan and clonidine protocols. He is assessed this morning in bed. Nursing staff report that the patient has largely been in his room due to ongoing withdrawal symptoms. He was not cooperative yesterday in completing his self inventory or going to the scheduled groups. The patient appeared to minimize his symptoms possibly due to desire to discharge. Patient informed that he was not identified as a weekend discharge by the treatment team. So far the patient has been compliant with his medications and denies any adverse reactions.   Diagnosis:   DSM5: Substance/Addictive Disorders:  Opioid Disorder - Severe (304.00), Cocaine related disorder, Benzodiazepine related disorder Depressive Disorders:  Major Depressive Disorder - Moderate (296.22) Total Time spent with patient: 20 minutes  Axis I: Substance Induced Mood Disorder  ADL's:  Intact  Sleep: Fair  Appetite:  Good  Psychiatric Specialty Exam: Physical Exam  Review of Systems  Constitutional: Positive for malaise/fatigue.  HENT: Negative.   Eyes: Negative.   Respiratory: Negative.   Cardiovascular: Negative.   Gastrointestinal: Negative.   Genitourinary: Negative.   Musculoskeletal: Positive for joint pain and myalgias.  Skin: Negative.   Neurological: Negative.   Endo/Heme/Allergies: Negative.   Psychiatric/Behavioral: Positive for depression and substance  abuse. The patient is nervous/anxious.     Blood pressure 88/58, pulse 65, temperature 98 F (36.7 C), temperature source Oral, resp. rate 16, height 6' (1.829 m), weight 68.04 kg (150 lb), SpO2 95.00%.Body mass index is 20.34 kg/(m^2).  General Appearance: Fairly Groomed  Patent attorney::  Fair  Speech:  Clear and Coherent  Volume:  Decreased  Mood:  Anxious  Affect:  Constricted  Thought Process:  Coherent and Goal Directed  Orientation:  Full (Time, Place, and Person)  Thought Content:  symptoms worries concerns wanting to go to rehab  Suicidal Thoughts:  No  Homicidal Thoughts:  No  Memory:  Immediate;   Fair Recent;   Fair Remote;   Fair  Judgement:  Fair  Insight:  Present  Psychomotor Activity:  Restlessness  Concentration:  Fair  Recall:  [employer] of Knowledge:NA  Language: Fair  Akathisia:  No  Handed:    AIMS (if indicated):     Assets:  Desire for Improvement Housing Social Support  Sleep:  Number of Hours: 6.85   Musculoskeletal: Strength & Muscle Tone: within normal limits Gait & Station: normal Patient leans: N/A  Current Medications: Current Facility-Administered Medications  Medication Dose Route Frequency Provider Last Rate Last Dose  . acetaminophen (TYLENOL) tablet 650 mg  650 mg Oral Q6H PRN Kerry Hough, PA-C      . alum & mag hydroxide-simeth (MAALOX/MYLANTA) 200-200-20 MG/5ML suspension 30 mL  30 mL Oral Q4H PRN Kerry Hough, PA-C      . cloNIDine (CATAPRES) tablet 0.1 mg  0.1 mg Oral QID Kerry Hough, PA-C   0.1 mg at 09/19/13 2116  Followed by  . cloNIDine (CATAPRES) tablet 0.1 mg  0.1 mg Oral BH-qamhs Kerry Hough, PA-C       Followed by  . [START ON 09/23/2013] cloNIDine (CATAPRES) tablet 0.1 mg  0.1 mg Oral QAC breakfast Kerry Hough, PA-C      . dicyclomine (BENTYL) tablet 20 mg  20 mg Oral Q6H PRN Kerry Hough, PA-C      . feeding supplement (ENSURE COMPLETE) (ENSURE COMPLETE) liquid 237 mL  237 mL Oral BID BM Tenny Craw, RD   237 mL at 09/18/13 1418  . gabapentin (NEURONTIN) capsule 100 mg  100 mg Oral TID Rachael Fee, MD   100 mg at 09/19/13 1610  . hydrOXYzine (ATARAX/VISTARIL) tablet 25 mg  25 mg Oral Q6H PRN Kerry Hough, PA-C      . loperamide (IMODIUM) capsule 2-4 mg  2-4 mg Oral PRN Kerry Hough, PA-C      . LORazepam (ATIVAN) tablet 1 mg  1 mg Oral Q6H PRN Rachael Fee, MD   1 mg at 09/19/13 2116  . LORazepam (ATIVAN) tablet 1 mg  1 mg Oral TID Rachael Fee, MD   1 mg at 09/19/13 1719   Followed by  . LORazepam (ATIVAN) tablet 1 mg  1 mg Oral BID Rachael Fee, MD       Followed by  . [START ON 09/22/2013] LORazepam (ATIVAN) tablet 1 mg  1 mg Oral Daily Rachael Fee, MD      . magnesium hydroxide (MILK OF MAGNESIA) suspension 30 mL  30 mL Oral Daily PRN Kerry Hough, PA-C      . methocarbamol (ROBAXIN) tablet 500 mg  500 mg Oral Q8H PRN Kerry Hough, PA-C   500 mg at 09/19/13 1546  . multivitamin with minerals tablet 1 tablet  1 tablet Oral Daily Rachael Fee, MD   1 tablet at 09/19/13 0857  . naproxen (NAPROSYN) tablet 500 mg  500 mg Oral BID PRN Kerry Hough, PA-C   500 mg at 09/19/13 2118  . nicotine (NICODERM CQ - dosed in mg/24 hours) patch 21 mg  21 mg Transdermal Q0600 Nehemiah Massed, MD   21 mg at 09/19/13 0856  . ondansetron (ZOFRAN-ODT) disintegrating tablet 4 mg  4 mg Oral Q6H PRN Kerry Hough, PA-C      . sertraline (ZOLOFT) tablet 100 mg  100 mg Oral BID Kerry Hough, PA-C   100 mg at 09/19/13 1715  . traZODone (DESYREL) tablet 100 mg  100 mg Oral QHS Kerry Hough, PA-C   100 mg at 09/19/13 2116    Lab Results: No results found for this or any previous visit (from the past 48 hour(s)).  Physical Findings: AIMS: Facial and Oral Movements Muscles of Facial Expression: None, normal Lips and Perioral Area: None, normal Jaw: None, normal Tongue: None, normal,Extremity Movements Upper (arms, wrists, hands, fingers): None, normal Lower (legs, knees,  ankles, toes): None, normal, Trunk Movements Neck, shoulders, hips: None, normal, Overall Severity Severity of abnormal movements (highest score from questions above): None, normal Incapacitation due to abnormal movements: None, normal Patient's awareness of abnormal movements (rate only patient's report): No Awareness, Dental Status Current problems with teeth and/or dentures?: No Does patient usually wear dentures?: No  CIWA:  CIWA-Ar Total: 2 COWS:  COWS Total Score: 5  Treatment Plan Summary: Daily contact with patient to assess and evaluate symptoms and progress in treatment Medication management  Plan: 1. Continue crisis management and stabilization.  2. Medication management:  -Continue Clonidine protocol for opiate detox -Continue Ativan taper for benzo detox -Continue Neurontin 100 mg TID for improved mood stability/anxiety  -Continue Zoloft 100 mg BID for depression.  -Continue Trazodone 100 mg hs for insomnia  3. Encouraged patient to attend groups and participate in group counseling sessions and activities.  4. Discharge plan in progress. Per LCSW notes [facility]  5. Continue current treatment plan.  6. Address health issues: Vitals reviewed and stable.   Medical Decision Making Problem Points:  Established problem, stable/improving (1) and Review of psycho-social stressors (1) Data Points:  Review of new medications or change in dosage (2)  I certify that inpatient services furnished can reasonably be expected to improve the patient's condition.   Punam Broussard NP-C 09/20/2013, 10:29 AM

## 2013-09-21 MED ORDER — NICOTINE POLACRILEX 2 MG MT GUM
2.0000 mg | CHEWING_GUM | OROMUCOSAL | Status: DC | PRN
  Administered 2013-09-21 – 2013-09-22 (×3): 2 mg via ORAL
  Filled 2013-09-21 (×2): qty 1

## 2013-09-21 NOTE — [facility] Group Notes (Signed)
[facility] Group Notes: (Clinical Social Work)   09/21/2013      Type of Therapy:  Group Therapy   Participation Level:  Did Not Attend    Erven Ramson Grossman-Orr, LCSW 09/21/2013, 2:28 PM     

## 2013-09-21 NOTE — [facility] Group Notes (Signed)
0900 nursing orientation group    The focus of this group is to educate the patient on the purpose and policies of crisis stabilization and provide a format to answer questions about their admission.  The group details unit policies and expectations of patients while admitted.  Pt did not attend he was in his bed and refused to come.

## 2013-09-21 NOTE — Progress Notes (Signed)
[facility] Group Notes:  (Nursing/MHT/Case Management/Adjunct)  Date:  09/21/2013  Time:  11:56 PM  Type of Therapy:  Psychoeducational Skills  Participation Level:  Active  Participation Quality:  Appropriate  Affect:  Appropriate  Cognitive:  Appropriate  Insight:  Improving  Engagement in Group:  Improving  Modes of Intervention:  Education  Summary of Progress/Problems: The patient verbalized that he had a better day as compared to yesterday. He states that he slept for much of the day. The patient had a visitor this evening but he did not mention that in group. As a theme for the day, his coping skill will be to attend A.A. Or N.A. Meetings.   Hazle Coca S 09/21/2013, 11:56 PM

## 2013-09-21 NOTE — Progress Notes (Signed)
Patient ID: Erik Davila, male   DOB: 1980/05/18, 34 y.o.   MRN: [id number] [facility] MD Progress Note  09/21/2013 3:17 PM Erik Davila  MRN:  [id number] Subjective:   Patient is observed sitting on the edge of the bed with his head down between his legs. He is able to give verbal answers of yes, no and I reckon. Patient denies any complaints or concerns at this time. "  Objective:  Patient is seen and chart is reviewed. Unable to assess patient completely due to drowsiness of the mediation. Nursing staff made aware if they need anything please inform writer or other clinical staff. Patient has declined medications and willingness to participate in group activities. His behavior while in the seclusion room is good, and non disruptive. He remains a 1:1 at this time for safety and elopement precautions. Please read previous note, patient making verbal threats to escape.   Diagnosis:   DSM5: Substance/Addictive Disorders:  Opioid Disorder - Severe (304.00), Cocaine related disorder, Benzodiazepine related disorder Depressive Disorders:  Major Depressive Disorder - Moderate (296.22) Total Time spent with patient: 20 minutes  Axis I: Substance Induced Mood Disorder  ADL's:  Intact  Sleep: Fair  Appetite:  Good  Psychiatric Specialty Exam: Physical Exam   Review of Systems  Constitutional: Positive for malaise/fatigue.  HENT: Negative.   Eyes: Negative.   Respiratory: Negative.   Cardiovascular: Negative.   Gastrointestinal: Negative.   Genitourinary: Negative.   Musculoskeletal: Positive for joint pain and myalgias.  Skin: Negative.   Neurological: Negative.   Endo/Heme/Allergies: Negative.   Psychiatric/Behavioral: Positive for depression and substance abuse. The patient is nervous/anxious.     Blood pressure 105/50, pulse 64, temperature 98 F (36.7 C), temperature source Oral, resp. rate 16, height 6' (1.829 m), weight 68.04 kg (150 lb), SpO2 96.00%.Body mass index is 20.34  kg/(m^2).  General Appearance: Fairly Groomed  Patent attorney::  Poor  Speech:  Slow  Volume:  Decreased  Mood:  Depressed  Affect:  Constricted  Thought Process:  Logical  Orientation:  Full (Time, Place, and Person)  Thought Content:  NA  Suicidal Thoughts:  No  Homicidal Thoughts:  No  Memory:  Immediate;   Fair Recent;   Fair Remote;   Fair  Judgement:  Fair  Insight:  Present  Psychomotor Activity:  Decreased  Concentration:  Fair  Recall:  [employer] of Knowledge:NA  Language: Fair  Akathisia:  No  Handed:    AIMS (if indicated):     Assets:  Desire for Improvement Housing Social Support  Sleep:  Number of Hours: 5.75   Musculoskeletal: Strength & Muscle Tone: within normal limits Gait & Station: normal Patient leans: N/A  Current Medications: Current Facility-Administered Medications  Medication Dose Route Frequency Provider Last Rate Last Dose  . acetaminophen (TYLENOL) tablet 650 mg  650 mg Oral Q6H PRN Kerry Hough, PA-C      . alum & mag hydroxide-simeth (MAALOX/MYLANTA) 200-200-20 MG/5ML suspension 30 mL  30 mL Oral Q4H PRN Kerry Hough, PA-C      . cloNIDine (CATAPRES) tablet 0.1 mg  0.1 mg Oral BH-qamhs Kerry Hough, PA-C       Followed by  . [START ON 09/23/2013] cloNIDine (CATAPRES) tablet 0.1 mg  0.1 mg Oral QAC breakfast Kerry Hough, PA-C      . dicyclomine (BENTYL) tablet 20 mg  20 mg Oral Q6H PRN Kerry Hough, PA-C      . feeding supplement (ENSURE  COMPLETE) (ENSURE COMPLETE) liquid 237 mL  237 mL Oral BID BM Tenny Craw, RD   237 mL at 09/18/13 1418  . gabapentin (NEURONTIN) capsule 100 mg  100 mg Oral TID Rachael Fee, MD   100 mg at 09/19/13 1610  . hydrOXYzine (ATARAX/VISTARIL) tablet 25 mg  25 mg Oral Q6H PRN Kerry Hough, PA-C      . loperamide (IMODIUM) capsule 2-4 mg  2-4 mg Oral PRN Kerry Hough, PA-C      . LORazepam (ATIVAN) tablet 1 mg  1 mg Oral BID Nehemiah Massed, MD       Followed by  . [START ON 09/22/2013]  LORazepam (ATIVAN) tablet 1 mg  1 mg Oral Daily Nehemiah Massed, MD      . magnesium hydroxide (MILK OF MAGNESIA) suspension 30 mL  30 mL Oral Daily PRN Kerry Hough, PA-C      . methocarbamol (ROBAXIN) tablet 500 mg  500 mg Oral Q8H PRN Kerry Hough, PA-C   500 mg at 09/19/13 1546  . multivitamin with minerals tablet 1 tablet  1 tablet Oral Daily Rachael Fee, MD   1 tablet at 09/19/13 0857  . naproxen (NAPROSYN) tablet 500 mg  500 mg Oral BID PRN Kerry Hough, PA-C   500 mg at 09/19/13 2118  . nicotine (NICODERM CQ - dosed in mg/24 hours) patch 21 mg  21 mg Transdermal Q0600 Nehemiah Massed, MD   21 mg at 09/19/13 0856  . OLANZapine zydis (ZYPREXA) disintegrating tablet 5 mg  5 mg Oral BID PRN Truman Hayward, FNP      . ondansetron (ZOFRAN-ODT) disintegrating tablet 4 mg  4 mg Oral Q6H PRN Kerry Hough, PA-C      . sertraline (ZOLOFT) tablet 100 mg  100 mg Oral BID Kerry Hough, PA-C   100 mg at 09/19/13 1715  . traZODone (DESYREL) tablet 100 mg  100 mg Oral QHS Kerry Hough, PA-C   100 mg at 09/19/13 2116  . ziprasidone (GEODON) injection 20 mg  20 mg Intramuscular Once Caprice Kluver, MD        Lab Results: No results found for this or any previous visit (from the past 48 hour(s)).  Physical Findings: AIMS: Facial and Oral Movements Muscles of Facial Expression: None, normal Lips and Perioral Area: None, normal Jaw: None, normal Tongue: None, normal,Extremity Movements Upper (arms, wrists, hands, fingers): None, normal Lower (legs, knees, ankles, toes): None, normal, Trunk Movements Neck, shoulders, hips: None, normal, Overall Severity Severity of abnormal movements (highest score from questions above): None, normal Incapacitation due to abnormal movements: None, normal Patient's awareness of abnormal movements (rate only patient's report): No Awareness, Dental Status Current problems with teeth and/or dentures?: No Does patient usually wear dentures?: No  CIWA:   CIWA-Ar Total: 2 COWS:  COWS Total Score: 0  Treatment Plan Summary: Daily contact with patient to assess and evaluate symptoms and progress in treatment Medication management  Plan: 1. Continue crisis management and stabilization.  2. Medication management:  -Continue Clonidine protocol for opiate detox -Continue Ativan taper for benzo detox -Continue Neurontin 100 mg TID for improved mood stability/anxiety  -Continue Zoloft 100 mg BID for depression.  -Continue Trazodone 100 mg hs for insomnia  3. Encouraged patient to attend groups and participate in group counseling sessions and activities.  4. Discharge plan in progress. Per LCSW notes patient has a screening at [facility] on 09/23/13.  5. Continue current  treatment plan.  6. Address health issues: Vitals reviewed and stable.  7. As noted above continue 1:1 for safety and elopement precautions. Will continue use of prn medication for anxiety and agitation if needed.   Medical Decision Making Problem Points:  Established problem, stable/improving (1) and Review of psycho-social stressors (1) Data Points:  Review of new medications or change in dosage (2)  I certify that inpatient services furnished can reasonably be expected to improve the patient's condition.   Malachy Chamber S FNP-BC  09/21/2013, 3:17 PM

## 2013-09-21 NOTE — Progress Notes (Addendum)
1:1 note- Patient lying on mattress in quiet room asleep. No distress noted and sitter by his side. 1:1 continues, pt is safe.   ( note was done at appropriate time but unable to find it)

## 2013-09-21 NOTE — Progress Notes (Signed)
1425 1:1 note D Pt is observed lying on floor...asleep in the seclusion room. He is breathing slow, evenly and regularly.  Slept through lunch time as well as lunchtime meds.   A He grunts and shakes his head " no" when this nurse awakened him up at 1245, offered him opportunity to toilet himself, drink liquids and take meds and eat. HE refused all of these things  And went back to sleep.    R Safety  Is in place and poc cont with 1:1 in place and pt remains safe and calm.

## 2013-09-21 NOTE — Progress Notes (Signed)
D: Pt presents with flat affect, anxious mood.  Pt states "I've been sleeping most of the day.  My goal was to be more relaxed and not make an ass out of myself like I did yesterday.  I mean, I'm not leaving until they so.  I feel like I'm starting to detox off of Suboxone now though, I was taking it for about 8 months and I have some aches but that's it."  Pt denies SI/HI, denies AH/VH. A: Encouragement and support offered.  Medications administered per order.  Safety maintained with 1:1 staff. R: Pt interacts with peers and staff appropriately.  Pt verbally contracts for safety and is in no acute distress at this time.  Pt attended evening group.  1:1 staff remains within arm's length of pt for safety.

## 2013-09-21 NOTE — Progress Notes (Signed)
1:1 D Pt is observed sleeping on his side, resting quietly in his bed with covers pulled to his chin. HE has denied need and / or want for any of his regularly - scheduled meds that he missed  / refused through out the course of the day today.    A HE refused to complete am self inventory and denied SI, HI saying " just leave me alone".  ` R 1:1  ontd and safety is in place.

## 2013-09-21 NOTE — Progress Notes (Signed)
1:1 1000  D  Pt is laying on bed in seclusion room...sleeping peacefully. He is in NAD, his respirations ae slow, even and unlabored. HE did not awaken for breakfast and has slept through first morning group.     A MHT sitter is at pt's side.    R Safety is in place and poc maintaiend.

## 2013-09-21 NOTE — Progress Notes (Signed)
1:1 note - Patient lying down in quiet room asleep with eyes closed  And respirations even and unlabored, no distress noted. Safety maintained on unit and sitter at patient's side. Patient remains safe.

## 2013-09-22 DIAGNOSIS — F329 Major depressive disorder, single episode, unspecified: Secondary | ICD-10-CM

## 2013-09-22 MED ORDER — SERTRALINE HCL 100 MG PO TABS: 100.0000 mg | ORAL_TABLET | Freq: Two times a day (BID) | ORAL | Status: AC

## 2013-09-22 MED ORDER — GABAPENTIN 100 MG PO CAPS: 100.0000 mg | ORAL_CAPSULE | Freq: Three times a day (TID) | ORAL | Status: AC

## 2013-09-22 MED ORDER — HYDROXYZINE HCL 25 MG PO TABS: ORAL_TABLET | ORAL | Status: AC

## 2013-09-22 MED ORDER — HYDROXYZINE HCL 25 MG PO TABS
25.0000 mg | ORAL_TABLET | Freq: Three times a day (TID) | ORAL | Status: DC | PRN
  Filled 2013-09-22: qty 30

## 2013-09-22 MED ORDER — TRAZODONE HCL 100 MG PO TABS: 100.0000 mg | ORAL_TABLET | Freq: Every day | ORAL | Status: AC

## 2013-09-22 NOTE — Progress Notes (Signed)
Pt is calm, cooperative with staff.  Pt is in no acute distress.  Respirations even, unlabored, WNL.  Remains on 1:1 for safety.

## 2013-09-22 NOTE — [facility] Suicide Risk Assessment (Signed)
Suicide Risk Assessment  Discharge Assessment     Demographic Factors:  Male and Caucasian  Total Time spent with patient: 30 minutes  Psychiatric Specialty Exam:     Blood pressure 95/68, pulse 94, temperature 99 F (37.2 C), temperature source Oral, resp. rate 18, height 6' (1.829 m), weight 68.04 kg (150 lb), SpO2 96.00%.Body mass index is 20.34 kg/(m^2).  General Appearance: Fairly Groomed  Patent attorney::  Fair  Speech:  Clear and Coherent  Volume:  Normal  Mood:  Euthymic  Affect:  Appropriate  Thought Process:  Coherent and Goal Directed  Orientation:  Full (Time, Place, and Person)  Thought Content:  plans as he moves on, relapse prevention plan  Suicidal Thoughts:  No  Homicidal Thoughts:  No  Memory:  Immediate;   Fair Recent;   Fair Remote;   Fair  Judgement:  Fair  Insight:  Present  Psychomotor Activity:  Normal  Concentration:  Fair  Recall:  [employer] of Knowledge:NA  Language: Fair  Akathisia:  No  Handed:    AIMS (if indicated):     Assets:  Desire for Improvement Housing Social Support  Sleep:  Number of Hours: 6.75    Musculoskeletal: Strength & Muscle Tone: within normal limits Gait & Station: normal Patient leans: N/A   Mental Status Per Nursing Assessment::   On Admission:  Suicidal ideation indicated by patient;Self-harm thoughts;Self-harm behaviors  Current Mental Status by Physician: In full contact with reality. There are no active S/S of withdrawal. Mood is euthymic affect is appropriated. Will be going to [place] today and will be at [facility] residential treatment center in the morning   Loss Factors: NA  Historical Factors: NA  Risk Reduction Factors:   Sense of responsibility to family, Living with another person, especially a relative and Positive social support  Continued Clinical Symptoms:  Depression:   Comorbid alcohol abuse/dependence Alcohol/Substance Abuse/Dependencies  Cognitive Features That Contribute To Risk:   Closed-mindedness Polarized thinking Thought constriction (tunnel vision)    Suicide Risk:  Minimal: No identifiable suicidal ideation.  Patients presenting with no risk factors but with morbid ruminations; may be classified as minimal risk based on the severity of the depressive symptoms  Discharge Diagnoses:   AXIS I:  Opioid, Benzodiazepine Dependence, Cocaine Abuse, Major Depression recurrent, Substance Induced Mood Disorder AXIS II:  No diagnosis AXIS III:   Past Medical History  Diagnosis Date  . Anxiety   . Mental disorder   . Bipolar disorder   . Insomnia    AXIS IV:  other psychosocial or environmental problems AXIS V:  61-70 mild symptoms  Plan Of Care/Follow-up recommendations:  Activity:  as tolerated Diet:  regular Follow up [facility] Is patient on multiple antipsychotic therapies at discharge:  No   Has Patient had three or more failed trials of antipsychotic monotherapy by history:  No  Recommended Plan for Multiple Antipsychotic Therapies: NA    Oniel Meleski A 09/22/2013, 11:32 AM

## 2013-09-22 NOTE — Progress Notes (Addendum)
[facility] Adult Case Management Discharge Plan :  Will you be returning to the same living situation after discharge: Yes,  patient will be returning home. At discharge, do you have transportation home?:Yes,  patient reports that his wife will be providing transportation home. Do you have the ability to pay for your medications:Yes,  patient will be provided with necessary medication samples and prescriptions at discharge.   Release of information consent forms completed and in the chart;  Patient's signature needed at discharge.  Patient to Follow up at: Follow-up Information   Follow up with [facility] On 09/23/2013. (Please present to [facility] on this date at 8 am for admissions screening. Please bring clothing, medications, and I.D. Please call office to reschedule appointment if needed.)    Contact information:   [address] [place],  [address] P: [phone]      Follow up with [facility]. (Please follow up with [facility] Monday-Friday 8am-3pm for assessment for therapy and medication management.)    Specialty:  Behavioral Health   Contact information:   [address] [place] [address] [phone]       Patient denies SI/HI:   Yes,  denies    Safety Planning and Suicide Prevention discussed:  Yes,  with patient and wife.  Hadlea Furuya, [place] 09/22/2013, 9:47 AM

## 2013-09-22 NOTE — Plan of Care (Signed)
Problem: Ineffective individual coping Goal: STG: Patient will remain free from self harm Outcome: Progressing Pt has remained free from self harm so far this shift.    Problem: Alteration in mood Goal: LTG-Patient reports reduction in suicidal thoughts (Patient reports reduction in suicidal thoughts and is able to verbalize a safety plan for whenever patient is feeling suicidal)  Outcome: Progressing Pt denies SI this shift.   Goal: STG-Patient reports thoughts of self-harm to staff Outcome: Progressing Pt agreed to notify staff if thinking of harming self.    Problem: Alteration in mood & ability to function due to Goal: LTG-Pt reports reduction in suicidal thoughts (Patient reports reduction in suicidal thoughts and is able to verbalize a safety plan for whenever patient is feeling suicidal)  Outcome: Progressing Denies SI this shift.   Goal: STG-Patient will report withdrawal symptoms Outcome: Progressing Pt reported withdrawal symptoms of anxiety, aches  Goal: STG-Patient will comply with prescribed medication regimen (Patient will comply with prescribed medication regimen)  Outcome: Progressing Pt was compliant with all scheduled medications this shift.    Problem: Diagnosis: Increased Risk For Suicide Attempt Goal: STG-Patient Will Report Suicidal Feelings to Staff Outcome: Progressing Pt agreed to notify staff of SI.  Goal: STG-Patient Will Comply With Medication Regime Outcome: Progressing Pt was compliant with medications this shift.
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
## 2013-09-22 NOTE — Progress Notes (Addendum)
NSG shift assessment. 7a-7p.   D: Affect blunted, brightens on approach, mood improved so that his behavior is appropriate and he expressed the desire to be discharged. Denies SI/HI. Attends groups and participates. Cooperative with staff and is getting along well with peers. Goal is to go home to spend time with his family before going into residential treatment tomorrow. To meet his goal he will act like a responsible adult and listen more than speaking.   A: Observed pt interacting in group and in the milieu: Support and encouragement offered. Safety maintained with observations every 15 minutes.   R:  Contracts for safety and continues to follow the treatment plan, working on learning new coping skills.

## 2013-09-22 NOTE — Progress Notes (Signed)
Pt remains on 1:1 observation.  Pt is resting in his room, respirations even, unlabored, WNL.  Pt is in no acute distress. 1:1 continues for safety.

## 2013-09-22 NOTE — [facility] Group Notes (Signed)
   [facility] LCSW Aftercare Discharge Planning Group Note  09/22/2013  8:45 AM   Participation Quality: Alert, Appropriate and Oriented  Mood/Affect: Depressed and Flat  Depression Rating: 0  Anxiety Rating: 0  Thoughts of Suicide: Pt denies SI/HI  Will you contract for safety? Yes  Current AVH: Pt denies  Plan for Discharge/Comments: Pt attended discharge planning group and actively participated in group. CSW provided pt with today's workbook. Patient reports that he feels "fine" today and believes that he is ready to discharge today before following up with [facility] admissions screening on Tuesday 09/23/13.   Transportation Means: Pt reports access to transportation.  Supports: No supports mentioned at this time  Samuella Bruin, MSW, [employer] Clinical Social Worker [facility] [phone]

## 2013-09-23 NOTE — Consult Note (Signed)
Case discussed, agree with plan. Patient needs inpatient admission

## 2013-09-25 NOTE — Progress Notes (Signed)
Patient Discharge Instructions:  After Visit Summary (AVS):   Faxed to:  09/25/13 Discharge Summary Note:   Faxed to:  09/25/13 Psychiatric Admission Assessment Note:   Faxed to:  09/25/13 Suicide Risk Assessment - Discharge Assessment:   Faxed to:  09/25/13 Faxed/Sent to the Next Level Care provider:  09/25/13 Faxed to [facility] @ [phone]  Jerelene Redden, 09/25/2013, 2:59 PM

## 2013-10-15 NOTE — Progress Notes (Signed)
Case discussed with NP.  Chart reviewed. Agree with above assessment and plan.  Chay Mazzoni, MD 

## 2014-04-17 ENCOUNTER — Emergency Department (HOSPITAL_COMMUNITY)
Admission: EM | Admit: 2014-04-17 | Discharge: 2014-04-21 | Disposition: A | Discharge status: Home / Self Care | Payer: Medicaid Other | Attending: Emergency Medicine | Admitting: Emergency Medicine

## 2014-04-17 ENCOUNTER — Encounter (HOSPITAL_COMMUNITY): Payer: Self-pay | Admitting: Emergency Medicine

## 2014-04-17 DIAGNOSIS — Z8669 Personal history of other diseases of the nervous system and sense organs: Secondary | ICD-10-CM | POA: Diagnosis not present

## 2014-04-17 DIAGNOSIS — Z046 Encounter for general psychiatric examination, requested by authority: Secondary | ICD-10-CM | POA: Diagnosis present

## 2014-04-17 DIAGNOSIS — R45850 Homicidal ideations: Secondary | ICD-10-CM

## 2014-04-17 DIAGNOSIS — F332 Major depressive disorder, recurrent severe without psychotic features: Secondary | ICD-10-CM | POA: Diagnosis not present

## 2014-04-17 DIAGNOSIS — R45851 Suicidal ideations: Secondary | ICD-10-CM | POA: Insufficient documentation

## 2014-04-17 LAB — RAPID URINE DRUG SCREEN, HOSP PERFORMED
Amphetamines: POSITIVE — AB
BARBITURATES: NOT DETECTED
Benzodiazepines: NOT DETECTED
Cocaine: NOT DETECTED
OPIATES: NOT DETECTED
TETRAHYDROCANNABINOL: POSITIVE — AB

## 2014-04-17 LAB — COMPREHENSIVE METABOLIC PANEL
ALK PHOS: 75 U/L (ref 39–117)
ALT: 16 U/L (ref 0–53)
ANION GAP: 8 (ref 5–15)
AST: 20 U/L (ref 0–37)
Albumin: 5.1 g/dL (ref 3.5–5.2)
BUN: 19 mg/dL (ref 6–23)
CHLORIDE: 103 mmol/L (ref 96–112)
CO2: 25 mmol/L (ref 19–32)
CREATININE: 0.96 mg/dL (ref 0.50–1.35)
Calcium: 9.6 mg/dL (ref 8.4–10.5)
Glucose, Bld: 95 mg/dL (ref 70–99)
Potassium: 4.2 mmol/L (ref 3.5–5.1)
Sodium: 136 mmol/L (ref 135–145)
Total Bilirubin: 0.6 mg/dL (ref 0.3–1.2)
Total Protein: 8.3 g/dL (ref 6.0–8.3)

## 2014-04-17 LAB — CBC
HCT: 44.2 % (ref 39.0–52.0)
Hemoglobin: 15 g/dL (ref 13.0–17.0)
MCH: 29.5 pg (ref 26.0–34.0)
MCHC: 33.9 g/dL (ref 30.0–36.0)
MCV: 87 fL (ref 78.0–100.0)
PLATELETS: 251 10*3/uL (ref 150–400)
RBC: 5.08 MIL/uL (ref 4.22–5.81)
RDW: 12.6 % (ref 11.5–15.5)
WBC: 8.1 10*3/uL (ref 4.0–10.5)

## 2014-04-17 LAB — ETHANOL

## 2014-04-17 LAB — ACETAMINOPHEN LEVEL: Acetaminophen (Tylenol), Serum: <10 ug/mL — ABNORMAL LOW (ref 10–30)

## 2014-04-17 LAB — SALICYLATE LEVEL: Salicylate Lvl: <4 mg/dL (ref 2.8–20.0)

## 2014-04-17 MED ORDER — ONDANSETRON HCL 4 MG PO TABS: 4.0000 mg | ORAL_TABLET | Freq: Three times a day (TID) | ORAL | Status: DC | PRN

## 2014-04-17 MED ORDER — OLANZAPINE 10 MG PO TBDP: 10.0000 mg | ORAL_TABLET | Freq: Three times a day (TID) | ORAL | Status: DC | PRN

## 2014-04-17 MED ORDER — IBUPROFEN 200 MG PO TABS: 600.0000 mg | ORAL_TABLET | Freq: Three times a day (TID) | ORAL | Status: DC | PRN

## 2014-04-17 MED ORDER — ZIPRASIDONE MESYLATE 20 MG IM SOLR: 20.0000 mg | INTRAMUSCULAR | Status: DC | PRN

## 2014-04-17 MED ORDER — LORAZEPAM 1 MG PO TABS: 1.0000 mg | ORAL_TABLET | ORAL | Status: DC | PRN

## 2014-04-17 MED ORDER — NICOTINE 21 MG/24HR TD PT24
21.0000 mg | MEDICATED_PATCH | Freq: Every day | TRANSDERMAL | Status: DC
  Administered 2014-04-19: 21 mg via TRANSDERMAL
  Filled 2014-04-17 (×2): qty 1

## 2014-04-17 MED ORDER — ALUM & MAG HYDROXIDE-SIMETH 200-200-20 MG/5ML PO SUSP: 30.0000 mL | ORAL | Status: DC | PRN

## 2014-04-17 MED ORDER — ZOLPIDEM TARTRATE 5 MG PO TABS: 5.0000 mg | ORAL_TABLET | Freq: Every evening | ORAL | Status: DC | PRN

## 2014-04-17 NOTE — BH Assessment (Signed)
Assessment completed. Consulted Hulan FessIjeoma Nwaeze, NP who recommended that pt be re-evaluated by psychiatry in the morning. Robyn Hess, PA-C has been informed of the recommendation.
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
## 2014-04-18 DIAGNOSIS — F332 Major depressive disorder, recurrent severe without psychotic features: Secondary | ICD-10-CM

## 2014-04-18 DIAGNOSIS — R45850 Homicidal ideations: Secondary | ICD-10-CM | POA: Insufficient documentation

## 2014-04-18 MED ORDER — HYDROXYZINE HCL 25 MG PO TABS
25.0000 mg | ORAL_TABLET | Freq: Four times a day (QID) | ORAL | Status: DC | PRN
Start: 2014-04-18 — End: 2014-04-21

## 2014-04-18 MED ORDER — METHOCARBAMOL 500 MG PO TABS: 500.0000 mg | ORAL_TABLET | Freq: Three times a day (TID) | ORAL | Status: DC | PRN

## 2014-04-18 MED ORDER — LOPERAMIDE HCL 2 MG PO CAPS
2.0000 mg | ORAL_CAPSULE | ORAL | Status: DC | PRN
Start: 2014-04-18 — End: 2014-04-21

## 2014-04-18 MED ORDER — NAPROXEN 500 MG PO TABS: 500.0000 mg | ORAL_TABLET | Freq: Two times a day (BID) | ORAL | Status: DC | PRN

## 2014-04-18 MED ORDER — CLONIDINE HCL 0.1 MG PO TABS: 0.1000 mg | ORAL_TABLET | Freq: Every day | ORAL | Status: DC

## 2014-04-18 MED ORDER — ONDANSETRON 4 MG PO TBDP: 4.0000 mg | ORAL_TABLET | Freq: Four times a day (QID) | ORAL | Status: DC | PRN

## 2014-04-18 MED ORDER — DICYCLOMINE HCL 20 MG PO TABS: 20.0000 mg | ORAL_TABLET | Freq: Four times a day (QID) | ORAL | Status: DC | PRN

## 2014-04-18 MED ORDER — CLONIDINE HCL 0.1 MG PO TABS: 0.1000 mg | ORAL_TABLET | ORAL | Status: DC

## 2014-04-18 MED ORDER — CLONIDINE HCL 0.1 MG PO TABS
0.1000 mg | ORAL_TABLET | Freq: Four times a day (QID) | ORAL | Status: AC
  Filled 2014-04-18: qty 1

## 2014-04-18 NOTE — ED Notes (Signed)
Pt acuity high. Pt IVC, labile and refusing to cooperate with staff 

## 2014-04-18 NOTE — ED Notes (Signed)
Acuity level remains high.  Patient is detoxing off Suboxone and refuses any medications that are offered.  Remains irritable.  When questioned about his UDS he states he is positive for amphetamines, but none have been prescribed according to records pulled up on the computer.  He also states that he takes Sudafed.  Has stayed in bed in his room most of the day.  Has not eaten today.
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
## 2014-04-18 NOTE — ED Notes (Signed)
Patient refusing to talk to RN. Pt labile and agitated. Pt refusing to open his eyes, but only states "Leave me the hell alone". No distress noted.

## 2014-04-18 NOTE — ED Notes (Signed)
Acuity level moderate;  Patient has been on the unit less than 24 hours and continues to be anxious.  He is also detoxing from several drugs.
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
## 2014-04-18 NOTE — BHH Counselor (Signed)
Binnie RailJoann Glover, [facility], confirms Adult unit is currently at capacity. Contacted the following facilities for placement:  BED AVAILABLE, FAXED CLINICAL INFORMATION: [facility], per [facility], per [place]  AT CAPACITY: [facility], per [facility], per [facility], per [facility], per [facility], per [facility], per [facility], per [facility], per [facility], per [facility] [place], per [facility], per [facility] [place], per [facility], per [employer], per [employer] [place], per [facility]  NO RESPONSE: [facility] Brynn Marr   Evelynne Spiers Orson EvaEllis Jenefer Woerner Jr, WisconsinLPC, [facility] Triage Specialist [phone]

## 2014-04-18 NOTE — ED Notes (Signed)
Pt sleeping at present, no distress noted, monitoring for safety, q 15 min checks in place. 

## 2014-04-18 NOTE — ED Notes (Signed)
Acuity level is high.  Patient is irritable and asking to leave.  He has been placed on the Clonidine protocol for opiate detox.  He is currently withdrawing from Suboxone.

## 2014-04-19 DIAGNOSIS — R45850 Homicidal ideations: Secondary | ICD-10-CM | POA: Diagnosis not present

## 2014-04-19 DIAGNOSIS — F332 Major depressive disorder, recurrent severe without psychotic features: Secondary | ICD-10-CM | POA: Diagnosis not present
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
## 2014-04-19 NOTE — ED Notes (Signed)
Patient calm, cooperative. Denies complaint at present. Denies SI, HI, AVH.  Encouragement offered.  q 15 safety checks in place.

## 2014-04-19 NOTE — ED Notes (Signed)
Pt acuity high due to IVC, but pt asleep at this time.

## 2014-04-19 NOTE — ED Notes (Signed)
Pt acuity high. Pt IVC, labile and refusing to cooperate with staff

## 2014-04-20 DIAGNOSIS — R45851 Suicidal ideations: Secondary | ICD-10-CM | POA: Diagnosis not present

## 2014-04-20 DIAGNOSIS — F332 Major depressive disorder, recurrent severe without psychotic features: Secondary | ICD-10-CM | POA: Diagnosis not present

## 2014-04-20 NOTE — ED Notes (Signed)
[name] x 3, no distress noted, watching TV at present, monitoring for safety, q 15 min checks in place.  Pt calm & cooperative at present.

## 2014-04-20 NOTE — ED Notes (Signed)
Pt resting quietly in chair in hallway while his ankle bracelet charges. Respirations are even and unlabored. No acute distress noted. Safety has been maintained with q15 minute observation. Will continue current POC

## 2014-04-20 NOTE — ED Notes (Signed)
Pt resting quietly in bed with eyes closed. Respirations are even and unlabored. No acute distress noted. Safety has been maintained with q15 minute observation. Will continue current POC 

## 2014-04-20 NOTE — ED Notes (Signed)
Pt has been sleeping for long intervals. He got upset with his wife on the phone and had to be reminded to calm himself. His wife brought separation paperwork for him to sign, but writer refused to allow the paperwork on the unit fearing it would have a negative impact on pt and cause escalation.  He refused clonidine.He has offered no questions or concerns. He denies pain/discomfort. Safety has been maintained with q15 minute observation. Will continue current POC
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
## 2014-04-20 NOTE — BH Assessment (Signed)
Erik Davila, AC at [facility], confirmed adult unit is currently at capacity. Contacted the following facilities for placement:  BED AVAILABLE, FAXED CLINICAL INFORMATION: [facility], per [facility], per Kimberly  AT CAPACITY: [facility], per [facility], per [facility], per [facility], per [facility], per [facility], per Pat [facility], per [facility], per Kimberly [facility], per [facility], per [facility], per [facility], per [facility], per [facility], per [facility], per Claudine [facility], oer Robyn [facility], per Stephanie  NO RESPONSE: [facility]   Erik Davila [name] [name], LPC, N[facility] Triage Specialist [phone] 

## 2014-04-20 NOTE — Progress Notes (Signed)
CSW referred patient [facility]. CSW obtained authorization [id number] from 3/21 to 3/26. CSW completed phone demographics with Robinette.   Olga CoasterKristen Shamicka Inga, LCSW  Clinical Social Work  [facility] [phone]

## 2014-04-20 NOTE — ED Notes (Signed)
Officer on site to charge ankle bracelet

## 2014-04-20 NOTE — ED Notes (Signed)
Pt resting at present, no distress noted, calm & cooperative, will continue to monitor for safety, q 15 min checks in effect.

## 2014-04-21 NOTE — Progress Notes (Signed)
[facility] MD Progress Note  04/21/2014 1:07 PM Erik Davila  MRN:  [id number] Subjective:  Alert, cheerful, denying any desire to hurt himself or his wife.   Principal Problem: Major depressive disorder, recurrent, severe without psychotic behavior Diagnosis:   Patient Active Problem List   Diagnosis Date Noted  . Major depressive disorder, recurrent, severe without psychotic behavior [F33.2] 04/18/2014  . Homicidal ideation [R45.850]   . Opioid dependence [F11.20] 09/18/2013  . Benzodiazepine dependence [F13.20] 09/18/2013  . Cocaine abuse [F14.10] 09/18/2013  . Major depression [F32.2] 09/18/2013   Total Time spent with patient: 15 minutes   Past Medical History:  Past Medical History  Diagnosis Date  . Anxiety   . Mental disorder   . Bipolar disorder   . Insomnia    History reviewed. No pertinent past surgical history. Family History: History reviewed. No pertinent family history. Social History:  History  Alcohol Use No     History  Drug Use  . Yes  . Special: Cocaine, Benzodiazepines, Oxycodone    Comment: opiates/benxo's/heroin    History   Social History  . Marital Status: Married    Spouse Name: N/A  . Number of Children: N/A  . Years of Education: N/A   Social History Main Topics  . Smoking status: Current Every Day Smoker -- 1.50 packs/day    Types: Cigarettes  . Smokeless tobacco: Not on file  . Alcohol Use: No  . Drug Use: Yes    Special: Cocaine, Benzodiazepines, Oxycodone     Comment: opiates/benxo's/heroin  . Sexual Activity: Yes   Other Topics Concern  . None   Social History Narrative   Additional History:    Sleep: Good  Appetite:  Good   Assessment:   Musculoskeletal: Strength & Muscle Tone: within normal limits Gait & Station: normal Patient leans: N/A   Psychiatric Specialty Exam: Physical Exam  ROS  Blood pressure 138/98, pulse 106, temperature 98 F (36.7 C), temperature source Oral, resp. rate 16, SpO2 100 %.There is no  weight on file to calculate BMI.  General Appearance: Casual  Eye Contact::  Good  Speech:  Clear and Coherent  Volume:  Normal  Mood:  Euthymic  Affect:  Congruent  Thought Process:  Coherent and Logical  Orientation:  Full (Time, Place, and Person)  Thought Content:  Negative  Suicidal Thoughts:  No  Homicidal Thoughts:  No  Memory:  Immediate;   Good Recent;   Good Remote;   Good  Judgement:  Intact  Insight:  Fair  Psychomotor Activity:  Normal  Concentration:  Good  Recall:  Good  Fund of Knowledge:Good  Language: Good  Akathisia:  Negative  Handed:  Right  AIMS (if indicated):     Assets:  Communication Skills Desire for Improvement Physical Health  ADL's:  Intact  Cognition: WNL  Sleep:        Current Medications: Current Facility-Administered Medications  Medication Dose Route Frequency Provider Last Rate Last Dose  . alum & mag hydroxide-simeth (MAALOX/MYLANTA) 200-200-20 MG/5ML suspension 30 mL  30 mL Oral PRN Robyn M Hess, PA-C      . cloNIDine (CATAPRES) tablet 0.1 mg  0.1 mg Oral BH-qamhs Caprice Kluver, MD   0.1 mg at 04/20/14 0913   Followed by  . [START ON 04/22/2014] cloNIDine (CATAPRES) tablet 0.1 mg  0.1 mg Oral QAC breakfast Caprice Kluver, MD      . dicyclomine (BENTYL) tablet 20 mg  20 mg Oral Q6H PRN Vinay P Saranga,  MD      . hydrOXYzine (ATARAX/VISTARIL) tablet 25 mg  25 mg Oral Q6H PRN Caprice KluverVinay P Saranga, MD      . loperamide (IMODIUM) capsule 2-4 mg  2-4 mg Oral PRN Caprice KluverVinay P Saranga, MD      . OLANZapine zydis (ZYPREXA) disintegrating tablet 10 mg  10 mg Oral Q8H PRN Kathrynn Speedobyn M Hess, PA-C       And  . LORazepam (ATIVAN) tablet 1 mg  1 mg Oral PRN Robyn M Hess, PA-C       And  . ziprasidone (GEODON) injection 20 mg  20 mg Intramuscular PRN Robyn M Hess, PA-C      . methocarbamol (ROBAXIN) tablet 500 mg  500 mg Oral Q8H PRN Vinay Genia HaroldP Saranga, MD      . naproxen (NAPROSYN) tablet 500 mg  500 mg Oral BID PRN Caprice KluverVinay P Saranga, MD      . nicotine  (NICODERM CQ - dosed in mg/24 hours) patch 21 mg  21 mg Transdermal Daily Robyn M Hess, PA-C   21 mg at 04/19/14 [id number]  . ondansetron (ZOFRAN-ODT) disintegrating tablet 4 mg  4 mg Oral Q6H PRN Caprice KluverVinay P Saranga, MD      . zolpidem (AMBIEN) tablet 5 mg  5 mg Oral QHS PRN Kathrynn Speedobyn M Hess, PA-C       Current Outpatient Prescriptions  Medication Sig Dispense Refill  . Albuterol Sulfate (PROAIR HFA IN) Inhale 2 puffs into the lungs every 6 (six) hours as needed (wheezing and shortness of breath).    Marland Kitchen. amphetamine-dextroamphetamine (ADDERALL XR) 10 MG 24 hr capsule Take 10 mg by mouth daily.    . Buprenorphine HCl-Naloxone HCl 8-2 MG FILM Place 3.5 Film under the tongue 3 (three) times daily.     . fluticasone (FLOVENT HFA) 220 MCG/ACT inhaler Inhale 2 puffs into the lungs 2 (two) times daily as needed (wheezing and shortness of breath).    . pseudoephedrine (SUDAFED) 30 MG tablet Take 30 mg by mouth every 4 (four) hours as needed for congestion (congestion).    . traZODone (DESYREL) 100 MG tablet Take 1 tablet (100 mg total) by mouth at bedtime. For sleep 30 tablet 0  . gabapentin (NEURONTIN) 100 MG capsule Take 1 capsule (100 mg total) by mouth 3 (three) times daily. For substance withdrawal syndrome (Patient not taking: Reported on 04/17/2014) 90 capsule 0  . hydrOXYzine (ATARAX/VISTARIL) 25 MG tablet Take 1 tablet (25 mg) three times daily as needed for anxiety (Patient not taking: Reported on 04/17/2014) 45 tablet 0  . sertraline (ZOLOFT) 100 MG tablet Take 1 tablet (100 mg total) by mouth 2 (two) times daily. For dperession (Patient not taking: Reported on 04/17/2014) 60 tablet 0    Lab Results: No results found for this or any previous visit (from the past 48 hour(s)).  Physical Findings: AIMS:  , ,  ,  ,    CIWA:    COWS:  COWS Total Score: 0  Treatment Plan Summary: Discharge home today to be followed outpatient   Medical Decision Making:  Established Problem, Stable/Improving  (1)     [name] D 04/21/2014, 1:07 PM

## 2014-04-21 NOTE — Progress Notes (Signed)
Per psychiatrist and NP, patient psychiatrically stable for discharge home. Patietn provided with outpatient resources. Pt wife who completed IVC was informed of patient discharge by tts as part of duty to warn.   Olga CoasterKristen Ved Martos, LCSW  Clinical Social Work  [facility] [phone]

## 2014-04-21 NOTE — ED Notes (Signed)
Pt sleeping at present, respirations even and unlabored, no distress noted, monitoring for safety, q 15 min checks in effect.

## 2014-04-21 NOTE — ED Notes (Addendum)
Calm and cooperative this morning. Continues to refuse meds, but is pleasant about it. Per pt, MD told him he was going to be D/Cd. Denies SI/HI/AVH and contracts for safety. Otherwise offered no questions or concerns. Safety has been maintained with q15 minute observation. Will continue current POC

## 2014-04-21 NOTE — ED Notes (Addendum)
Calm and cooperative this morning. Continues to refuse meds, but is pleasant about it. Denies SI/HI/AVH and contracts for safety. Otherwise offered no questions or concerns. Safety has been maintained with q15 minute observation. Will continue current POC

## 2014-04-21 NOTE — [facility] Suicide Risk Assessment (Signed)
[facility]   Demographic Factors:  Married but wife seeking separation that he is in agreement with  Total Time spent with patient: 30 minutes  Musculoskeletal: Strength & Muscle Tone: within normal limits Gait & Station: normal Patient leans: N/A  Psychiatric Specialty Exam: Physical Exam  ROS  Blood pressure 138/98, pulse 106, temperature 98 F (36.7 C), temperature source Oral, resp. rate 16, SpO2 100 %.There is no weight on file to calculate BMI.  General Appearance: Casual  Eye Contact::  Good  Speech:  Clear and Coherent409  Volume:  Normal  Mood:  Euthymic  Affect:  Appropriate  Thought Process:  Coherent and Logical  Orientation:  Full (Time, Place, and Person)  Thought Content:  Negative  Suicidal Thoughts:  No  Homicidal Thoughts:  No  Memory:  Immediate;   Good Recent;   Good Remote;   Good  Judgement:  Intact  Insight:  Shallow  Psychomotor Activity:  Normal  Concentration:  Good  Recall:  Good  Fund of Knowledge:Good  Language: Good  Akathisia:  Negative  Handed:  Right  AIMS (if indicated):     Assets:  Communication Skills Desire for Improvement Physical Health  Sleep:     Cognition: WNL  ADL's:  Intact      Has this patient used any form of tobacco in the last 30 days? (Cigarettes, Smokeless Tobacco, Cigars, and/or Pipes) N/A  Mental Status Per Nursing Assessment::   On Admission:     Current Mental Status by Physician: NA  Loss Factors: Loss of significant relationship  Historical Factors: NA  Risk Reduction Factors:   NA  Continued Clinical Symptoms:  Alcohol/Substance Abuse/Dependencies  Cognitive Features That Contribute To Risk:  Closed-mindedness    Suicide Risk:  Minimal: No identifiable suicidal ideation.  Patients presenting with no risk factors but with morbid ruminations; may be classified as minimal risk based on the severity of the depressive symptoms  Principal Problem: Major  depressive disorder, recurrent, severe without psychotic behavior Discharge Diagnoses:  Patient Active Problem List   Diagnosis Date Noted  . Major depressive disorder, recurrent, severe without psychotic behavior [F33.2] 04/18/2014  . Homicidal ideation [R45.850]   . Opioid dependence [F11.20] 09/18/2013  . Benzodiazepine dependence [F13.20] 09/18/2013  . Cocaine abuse [F14.10] 09/18/2013  . Major depression [F32.2] 09/18/2013    Follow-up Information    [facility]   Specialty:  Behavioral Health   Why:  walkin hours 8-3   Contact information:   [address] [place] [address] [phone]       Plan Of Care/Follow-up recommendations:  Activity:  resume usual activity Diet:  resume usual diet  Is patient on multiple antipsychotic therapies at discharge:  No   Has Patient had three or more failed trials of antipsychotic monotherapy by history:  No  Recommended Plan for Multiple Antipsychotic Therapies: NA    Ammarie Matsuura D 04/21/2014, 1:17 PM

## 2014-06-19 ENCOUNTER — Emergency Department (HOSPITAL_COMMUNITY)
Admission: EM | Admit: 2014-06-19 | Discharge: 2014-06-19 | Discharge status: Against Medical Advice | Payer: Medicaid Other | Attending: Emergency Medicine | Admitting: Emergency Medicine

## 2014-06-19 ENCOUNTER — Encounter (HOSPITAL_COMMUNITY): Payer: Self-pay | Admitting: Emergency Medicine

## 2014-06-19 DIAGNOSIS — Z8669 Personal history of other diseases of the nervous system and sense organs: Secondary | ICD-10-CM | POA: Diagnosis not present

## 2014-06-19 DIAGNOSIS — Z720 Tobacco use: Secondary | ICD-10-CM | POA: Insufficient documentation

## 2014-06-19 DIAGNOSIS — F13220 Sedative, hypnotic or anxiolytic dependence with intoxication, uncomplicated: Secondary | ICD-10-CM | POA: Diagnosis not present

## 2014-06-19 DIAGNOSIS — F419 Anxiety disorder, unspecified: Secondary | ICD-10-CM | POA: Insufficient documentation

## 2014-06-19 DIAGNOSIS — F1320 Sedative, hypnotic or anxiolytic dependence, uncomplicated: Secondary | ICD-10-CM

## 2014-06-19 DIAGNOSIS — Z79899 Other long term (current) drug therapy: Secondary | ICD-10-CM | POA: Insufficient documentation

## 2014-06-19 DIAGNOSIS — F1310 Sedative, hypnotic or anxiolytic abuse, uncomplicated: Secondary | ICD-10-CM | POA: Diagnosis present

## 2014-06-19 DIAGNOSIS — F319 Bipolar disorder, unspecified: Secondary | ICD-10-CM | POA: Insufficient documentation

## 2014-06-19 HISTORY — DX: Other psychoactive substance use, unspecified, uncomplicated: F19.90

## 2014-06-19 LAB — COMPREHENSIVE METABOLIC PANEL
ALT: 14 U/L — ABNORMAL LOW (ref 17–63)
AST: 20 U/L (ref 15–41)
Albumin: 4.9 g/dL (ref 3.5–5.0)
Alkaline Phosphatase: 67 U/L (ref 38–126)
Anion gap: 11 (ref 5–15)
BUN: 18 mg/dL (ref 6–20)
CO2: 26 mmol/L (ref 22–32)
Calcium: 10 mg/dL (ref 8.9–10.3)
Chloride: 102 mmol/L (ref 101–111)
Creatinine, Ser: 1.08 mg/dL (ref 0.61–1.24)
GFR calc non Af Amer: >60 mL/min (ref >60)
Glucose, Bld: 82 mg/dL (ref 65–99)
POTASSIUM: 3.8 mmol/L (ref 3.5–5.1)
Sodium: 139 mmol/L (ref 135–145)
TOTAL PROTEIN: 7.8 g/dL (ref 6.5–8.1)
Total Bilirubin: 1 mg/dL (ref 0.3–1.2)

## 2014-06-19 LAB — CBC WITH DIFFERENTIAL/PLATELET
Basophils Absolute: 0 10*3/uL (ref 0.0–0.1)
Basophils Relative: 0 % (ref 0–1)
Eosinophils Absolute: 0.2 10*3/uL (ref 0.0–0.7)
Eosinophils Relative: 2 % (ref 0–5)
HEMATOCRIT: 40.7 % (ref 39.0–52.0)
Hemoglobin: 13.9 g/dL (ref 13.0–17.0)
LYMPHS PCT: 33 % (ref 12–46)
Lymphs Abs: 2.1 10*3/uL (ref 0.7–4.0)
MCH: 29.1 pg (ref 26.0–34.0)
MCHC: 34.2 g/dL (ref 30.0–36.0)
MCV: 85.3 fL (ref 78.0–100.0)
MONO ABS: 0.9 10*3/uL (ref 0.1–1.0)
Monocytes Relative: 15 % — ABNORMAL HIGH (ref 3–12)
NEUTROS ABS: 3.2 10*3/uL (ref 1.7–7.7)
Neutrophils Relative %: 50 % (ref 43–77)
Platelets: 211 10*3/uL (ref 150–400)
RBC: 4.77 MIL/uL (ref 4.22–5.81)
RDW: 12.5 % (ref 11.5–15.5)
WBC: 6.4 10*3/uL (ref 4.0–10.5)

## 2014-06-19 LAB — ACETAMINOPHEN LEVEL

## 2014-06-19 LAB — SALICYLATE LEVEL: Salicylate Lvl: <4 mg/dL (ref 2.8–30.0)

## 2014-06-19 LAB — ETHANOL

## 2014-06-19 NOTE — ED Notes (Addendum)
Per EMS-took Xanax and Meth tonight. Last few days patient reports crack and cocaine use. Hx substantial drug use. Wife reports patient cardiac arrested "at some point" after using drugs. Alert at this time. Moving all extremities. VS: BP 113/72 HR 92 RR 18.   Addendum-patient denies Meth use tonight or yesterday. Says he "only took Xanax and everyday stuff." Denies suicidal intentions or thoughts.

## 2014-06-19 NOTE — ED Notes (Signed)
Pt awake to verbal stimuli. Reports that he took xanax to go to sleep. Pt denies SI/HI.

## 2014-06-19 NOTE — ED Notes (Signed)
Bed: [id number] Expected date:  Expected time:  Means of arrival:  Comments: EMS taking numerous Xanax per day

## 2014-06-19 NOTE — ED Notes (Signed)
Pt refused vital signs or to sign AMA discharge.

## 2014-06-19 NOTE — ED Notes (Signed)
Pt standing up in room talking in phone. When this RN went to check on pt, he was dressed and reported that he was going home. PA made aware. If pt leaves, he will be leaving AMA.

## 2014-06-19 NOTE — ED Notes (Signed)
Pt is unable to give a urine sample at this time. 

## 2014-06-19 NOTE — ED Notes (Signed)
Pt is talking on the phone, however he sts he is getting ready to leave. Pt advised he will be leaving AMA, he voiced understanding.

## 2014-06-21 NOTE — ED Provider Notes (Signed)
CSN: [id number]     Arrival date & time 06/19/14  0533 History   First MD Initiated Contact with Patient 06/19/14 [phone]     Chief Complaint  Patient presents with  . Xanax Ingestion      (Consider location/radiation/quality/duration/timing/severity/associated sxs/prior Treatment) HPI Patient presents to the emergency department after taking "a lot" of Xanax.  The patient states that he was not trying to kill himself.  He is wanted to try to sleep because he had been using meth.  Patient was not very helpful in obtaining any history.  The wife did not come to the emergency department and she states that he does take a lot of drugs and that she has been trying to get him help, but he is unwilling to get help.  The patient denies that he was trying to harm himself.  The wife states that he recently moved out because they were separated due to his drug use Past Medical History  Diagnosis Date  . Anxiety   . Mental disorder   . Bipolar disorder   . Insomnia   . Illicit drug use    History reviewed. No pertinent past surgical history. History reviewed. No pertinent family history. History  Substance Use Topics  . Smoking status: Current Every Day Smoker -- 1.50 packs/day    Types: Cigarettes  . Smokeless tobacco: Not on file  . Alcohol Use: No    Review of Systems  Level V caveat applies due to uncooperativeness  Allergies  Keflex  Home Medications   Prior to Admission medications   Medication Sig Start Date End Date Taking? Authorizing Provider  Albuterol Sulfate (PROAIR HFA IN) Inhale 2 puffs into the lungs every 6 (six) hours as needed (wheezing and shortness of breath).   Yes Historical Provider, MD  fluticasone (FLOVENT HFA) 220 MCG/ACT inhaler Inhale 2 puffs into the lungs 2 (two) times daily as needed (wheezing and shortness of breath).   Yes Historical Provider, MD  SUBOXONE 8-2 MG FILM Take 1 each by mouth See admin instructions. Place 1 under tongue 3 times a day and 1/2  at the end of the day 06/09/14  Yes Historical Provider, MD  traZODone (DESYREL) 100 MG tablet Take 1 tablet (100 mg total) by mouth at bedtime. For sleep 09/22/13  Yes Sanjuana Kava, NP  gabapentin (NEURONTIN) 100 MG capsule Take 1 capsule (100 mg total) by mouth 3 (three) times daily. For substance withdrawal syndrome Patient not taking: Reported on 04/17/2014 09/22/13   Sanjuana Kava, NP  hydrOXYzine (ATARAX/VISTARIL) 25 MG tablet Take 1 tablet (25 mg) three times daily as needed for anxiety Patient not taking: Reported on 04/17/2014 09/22/13   Sanjuana Kava, NP  sertraline (ZOLOFT) 100 MG tablet Take 1 tablet (100 mg total) by mouth 2 (two) times daily. For dperession Patient not taking: Reported on 04/17/2014 09/22/13   Sanjuana Kava, NP   BP 123/85 mmHg  Pulse 86  Temp(Src) 97.5 F (36.4 C) (Oral)  SpO2 100% Physical Exam  Constitutional: He is oriented to person, place, and time. He appears well-developed and well-nourished. No distress.  HENT:  Head: Normocephalic and atraumatic.  Mouth/Throat: Oropharynx is clear and moist.  Eyes: Pupils are equal, round, and reactive to light.  Neck: Normal range of motion. Neck supple.  Cardiovascular: Normal rate, regular rhythm and normal heart sounds.  Exam reveals no gallop and no friction rub.   No murmur heard. Pulmonary/Chest: Effort normal and breath sounds normal. No respiratory  distress.  Musculoskeletal: He exhibits no edema.  Neurological: He is alert and oriented to person, place, and time. He exhibits normal muscle tone. Coordination normal.  Skin: Skin is warm and dry. No erythema.  Nursing note and vitals reviewed.   ED Course  Procedures (including critical care time) Labs Review Labs Reviewed  CBC WITH DIFFERENTIAL/PLATELET - Abnormal; Notable for the following:    Monocytes Relative 15 (*)    All other components within normal limits  COMPREHENSIVE METABOLIC PANEL - Abnormal; Notable for the following:    ALT 14 (*)    All  other components within normal limits  ACETAMINOPHEN LEVEL - Abnormal; Notable for the following:    Acetaminophen (Tylenol), Serum <10 (*)    All other components within normal limits  SALICYLATE LEVEL  ETHANOL   The patient states that he would like to leave AMA and the patient is advised that we do not have all the testing results back.  He still does not agree to stay.  I advised the patient that he is welcome to return any time.  He has not wanted any help for his drug use.  Again, the patient does not voice any suicidal thoughts    Charlestine NightChristopher Austen Oyster, PA-C 06/21/14 0747  Azalia BilisKevin Campos, MD 06/22/14 [phone]

## 2015-09-27 IMAGING — CR DG TOE GREAT 2+V*R*
3 series · 3 of 3 positions shown · non-contrast
Comparison: None.

CLINICAL DATA: Injured great toe.

EXAM:
RIGHT GREAT TOE

[t toes ap right]
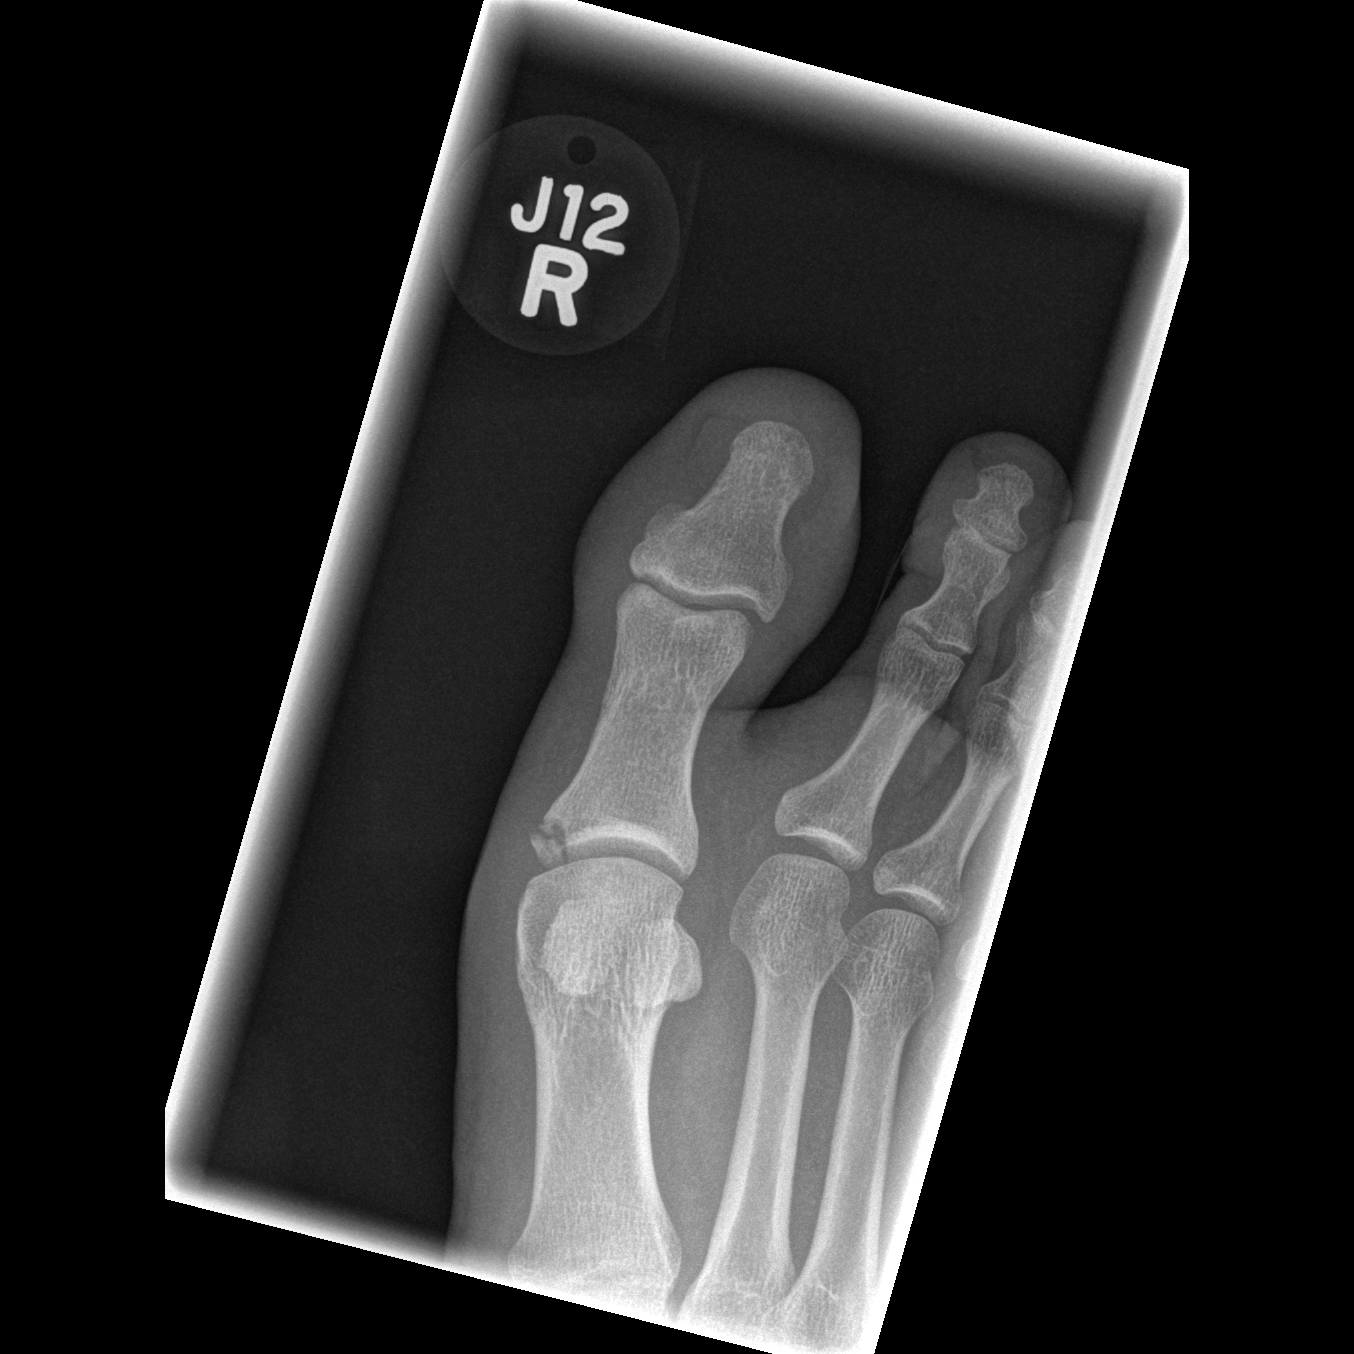

[t toes oblique right]
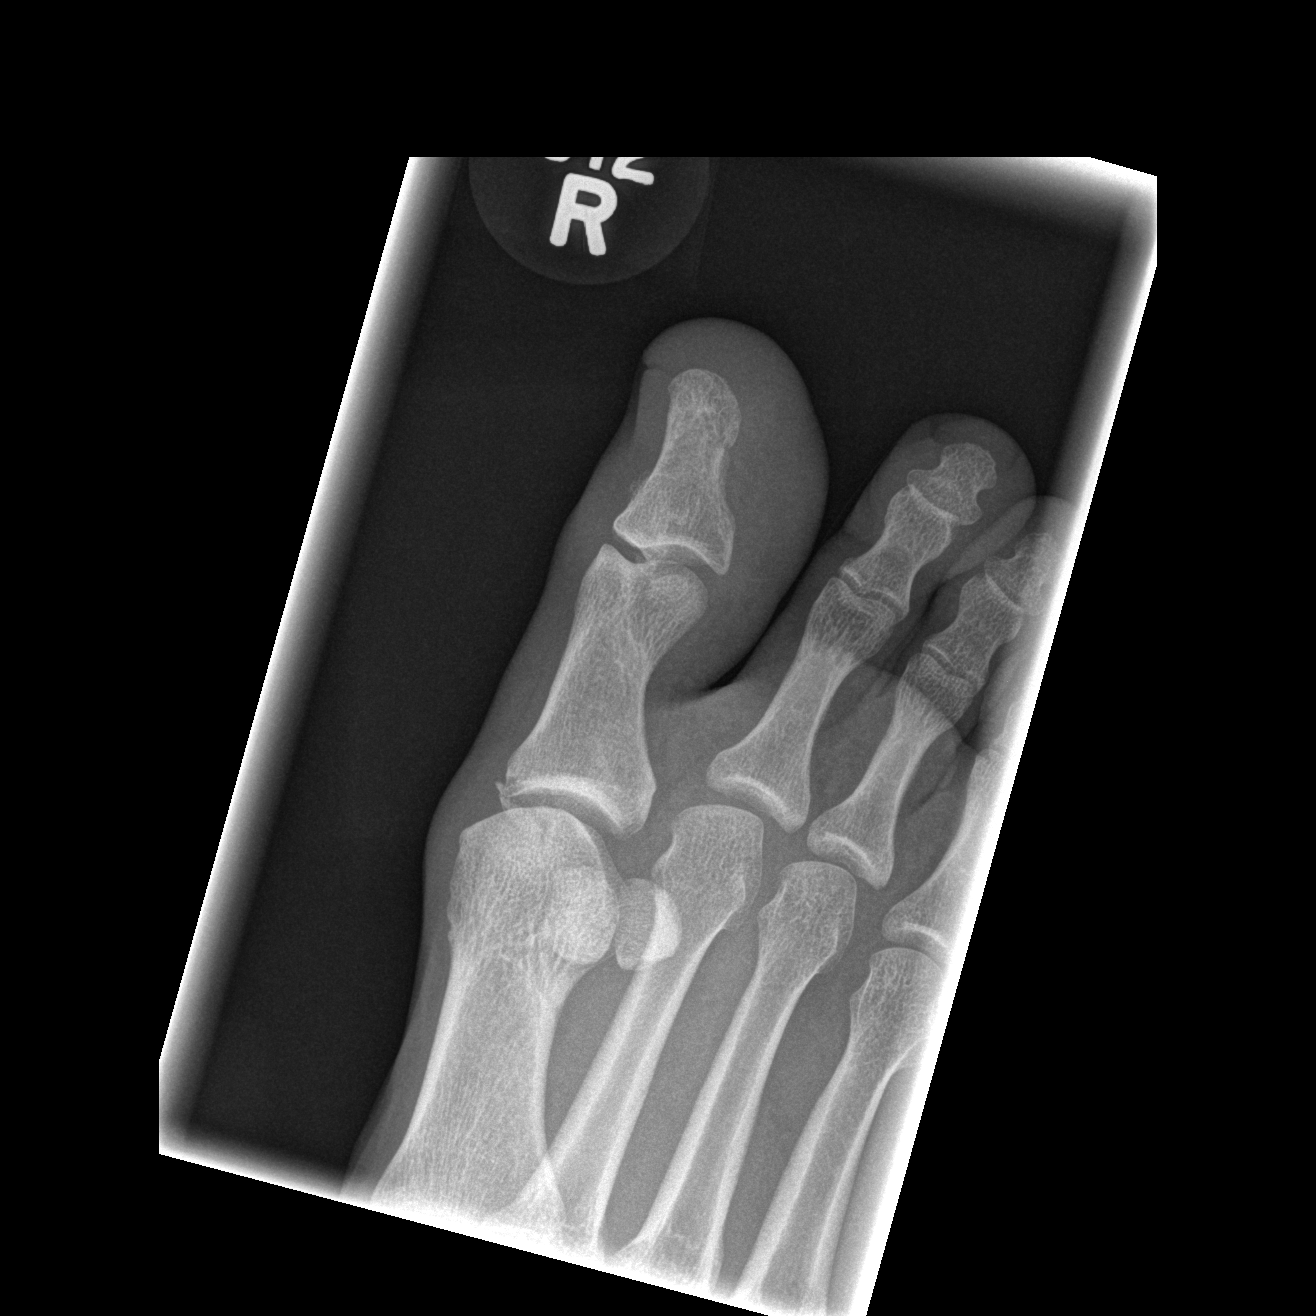

[t toes lateral right]
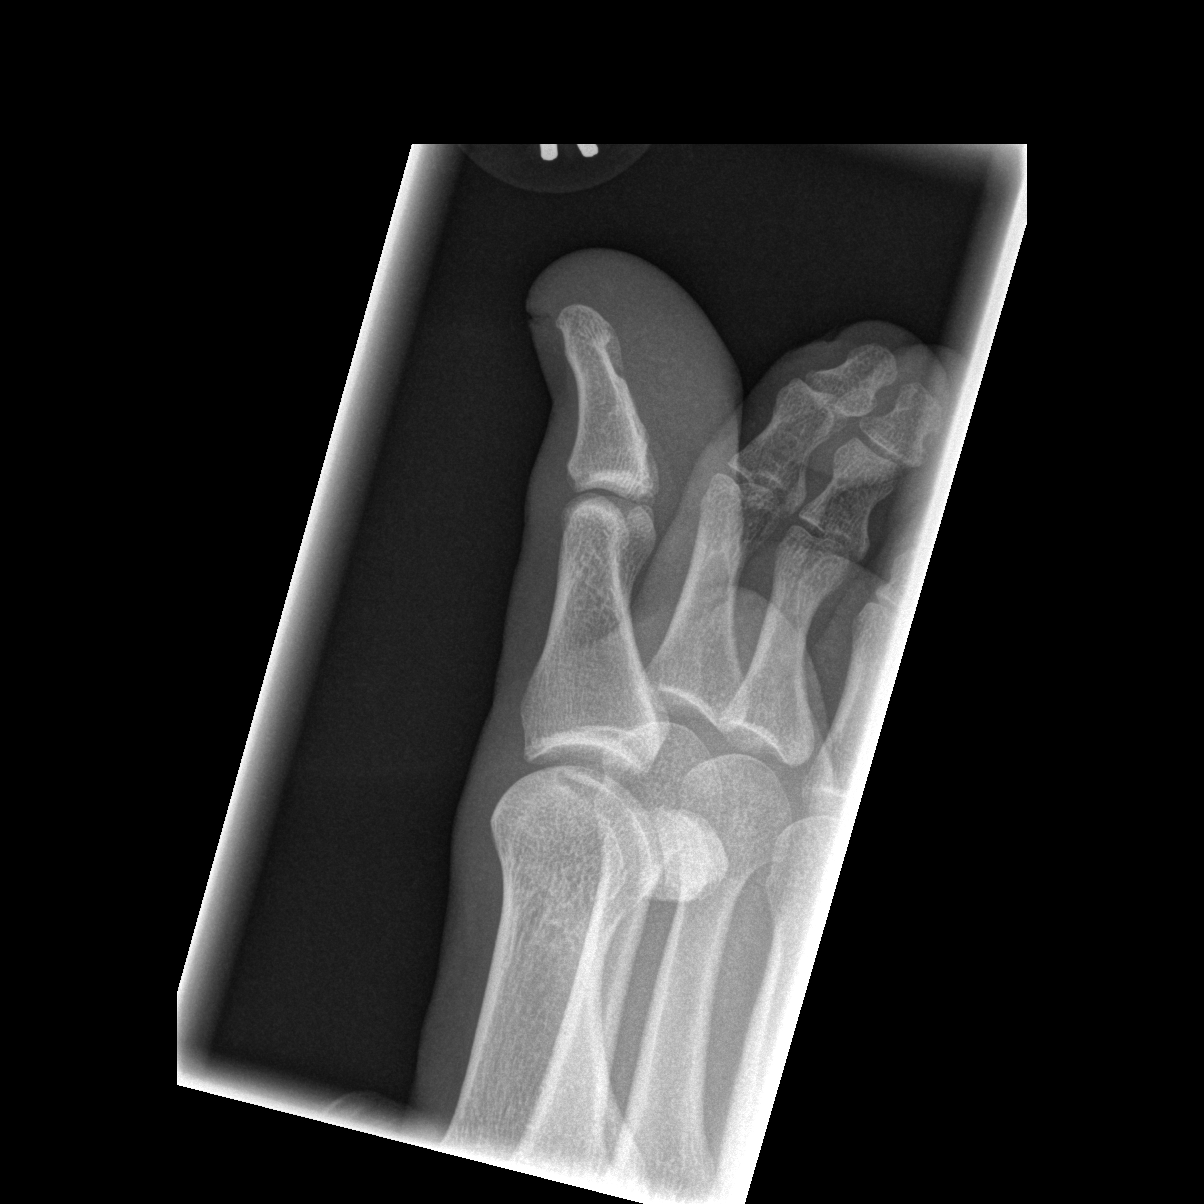

[3 of 3 positions shown; findings below may reference images not displayed]

FINDINGS: There is a minimally displaced fracture involving the base of the
proximal phalanx medially. This does involve the peripheral aspect
of the joint. The interphalangeal joint is normal.
IMPRESSION: Mildly comminuted and displaced fracture involving the medial corner
of the proximal phalanx at the joint.

## 2023-04-26 ENCOUNTER — Emergency Department (HOSPITAL_COMMUNITY)
Admission: EM | Admit: 2023-04-26 | Discharge: 2023-04-26 | Disposition: A | Discharge status: Home / Self Care | Attending: Emergency Medicine | Admitting: Emergency Medicine

## 2023-04-26 ENCOUNTER — Encounter (HOSPITAL_COMMUNITY): Payer: Self-pay

## 2023-04-26 ENCOUNTER — Other Ambulatory Visit: Payer: Self-pay

## 2023-04-26 DIAGNOSIS — I10 Essential (primary) hypertension: Secondary | ICD-10-CM | POA: Diagnosis not present

## 2023-04-26 DIAGNOSIS — K0889 Other specified disorders of teeth and supporting structures: Secondary | ICD-10-CM | POA: Diagnosis present

## 2023-04-26 DIAGNOSIS — K029 Dental caries, unspecified: Secondary | ICD-10-CM | POA: Diagnosis not present

## 2023-04-26 HISTORY — DX: Essential (primary) hypertension: I10

## 2023-04-26 HISTORY — DX: Attention-deficit hyperactivity disorder, unspecified type: F90.9

## 2023-04-26 HISTORY — DX: Gastro-esophageal reflux disease without esophagitis: K21.9

## 2023-04-26 MED ORDER — BENZOCAINE 10 % MT GEL
Freq: Once | OROMUCOSAL | Status: AC
  Administered 2023-04-26: 1 via OROMUCOSAL
  Filled 2023-04-26: qty 9.4

## 2023-04-26 MED ORDER — MELOXICAM 7.5 MG PO TABS: 7.5000 mg | ORAL_TABLET | Freq: Every day | ORAL | 0 refills | Status: AC

## 2023-04-26 MED ORDER — KETOROLAC TROMETHAMINE 15 MG/ML IJ SOLN
15.0000 mg | Freq: Once | INTRAMUSCULAR | Status: AC
  Administered 2023-04-26: 15 mg via INTRAMUSCULAR
  Filled 2023-04-26: qty 1

## 2023-04-26 NOTE — Discharge Instructions (Addendum)
 Thank you for let us evaluate you today.  Provided you with some Orajel and a anti-inflammatory antipain shot here in emergency department.  Also provided you with a meloxicam prescription for anti-inflammatory and antipain properties.  You may take this starting tomorrow.  You may also take Tylenol intermittently with this every 8 hours  Return to emergency department if you experience difficulty swallowing, drooling

## 2023-04-26 NOTE — ED Provider Notes (Signed)
 [place] EMERGENCY DEPARTMENT AT [facility] Provider Note   CSN: [id number] Arrival date & time: 04/26/23  2037     History  Chief Complaint  Patient presents with   Dental Pain    Erik Davila is a 43 y.o. male with past medical history of anxiety, bipolar, insomnia, HTN, polysubstance abuse presents emergency department for evaluation of left upper and lower dental pain that has been going on for several months.  He has been using Orajel and ibuprofen at home without much relief.  He presents today as pain has not been improved with these.  Has a dentist appointment late April. He denies difficulty swallowing, fevers   Dental Pain Associated symptoms: no fever and no headaches        Home Medications Prior to Admission medications   Medication Sig Start Date End Date Taking? Authorizing Provider  meloxicam (MOBIC) 7.5 MG tablet Take 1 tablet (7.5 mg total) by mouth daily. 04/26/23  Yes Judithann Sheen, PA  Albuterol Sulfate (PROAIR HFA IN) Inhale 2 puffs into the lungs every 6 (six) hours as needed (wheezing and shortness of breath).    [provider]  fluticasone (FLOVENT HFA) 220 MCG/ACT inhaler Inhale 2 puffs into the lungs 2 (two) times daily as needed (wheezing and shortness of breath).    [provider]  gabapentin (NEURONTIN) 100 MG capsule Take 1 capsule (100 mg total) by mouth 3 (three) times daily. For substance withdrawal syndrome Patient not taking: Reported on 04/17/2014 09/22/13   Armandina Stammer I, NP  hydrOXYzine (ATARAX/VISTARIL) 25 MG tablet Take 1 tablet (25 mg) three times daily as needed for anxiety Patient not taking: Reported on 04/17/2014 09/22/13   Armandina Stammer I, NP  sertraline (ZOLOFT) 100 MG tablet Take 1 tablet (100 mg total) by mouth 2 (two) times daily. For dperession Patient not taking: Reported on 04/17/2014 09/22/13   Armandina Stammer I, NP  SUBOXONE 8-2 MG FILM Take 1 each by mouth See admin instructions. Place 1 under  tongue 3 times a day and 1/2 at the end of the day 06/09/14   [provider]  traZODone (DESYREL) 100 MG tablet Take 1 tablet (100 mg total) by mouth at bedtime. For sleep 09/22/13   Armandina Stammer I, NP      Allergies    Keflex [cephalexin]    Review of Systems   Review of Systems  Constitutional:  Negative for chills, fatigue and fever.  HENT:  Positive for dental problem.   Respiratory:  Negative for cough, chest tightness, shortness of breath and wheezing.   Cardiovascular:  Negative for chest pain and palpitations.  Gastrointestinal:  Negative for abdominal pain, constipation, diarrhea, nausea and vomiting.  Neurological:  Negative for dizziness, seizures, weakness, light-headedness, numbness and headaches.    Physical Exam Updated Vital Signs BP (!) 153/96 (BP Location: Right Arm)   Pulse 91   Temp 98 F (36.7 C)   Resp 18   Ht 6' (1.829 m)   Wt 72.6 kg   SpO2 99%   BMI 21.70 kg/m  Physical Exam Vitals and nursing note reviewed.  Constitutional:      General: He is not in acute distress.    Appearance: Normal appearance.  HENT:     Head: Normocephalic and atraumatic.     Jaw: There is normal jaw occlusion. No trismus, tenderness or swelling.     Comments: No submandibular swelling nor tenderness    Right Ear: Tympanic membrane, ear canal  and external ear normal.     Left Ear: Tympanic membrane, ear canal and external ear normal.     Nose: Nose normal.     Right Sinus: No maxillary sinus tenderness or frontal sinus tenderness.     Left Sinus: No maxillary sinus tenderness or frontal sinus tenderness.     Mouth/Throat:     Mouth: Mucous membranes are moist. No oral lesions.     Dentition: Abnormal dentition. Dental tenderness and dental caries present. No gingival swelling or dental abscesses.     Tongue: No lesions.     Pharynx: Uvula midline. No pharyngeal swelling, oropharyngeal exudate, posterior oropharyngeal erythema or uvula swelling.     Tonsils: 0 on  the right. 0 on the left.      Comments: Gums do not appear infectious, swollen, nor erythematous. TTP of left upper tooth and gums Eyes:     Conjunctiva/sclera: Conjunctivae normal.  Cardiovascular:     Rate and Rhythm: Normal rate.  Pulmonary:     Effort: Pulmonary effort is normal. No respiratory distress.  Skin:    Coloration: Skin is not jaundiced or pale.  Neurological:     Mental Status: He is alert. Mental status is at baseline.     ED Results / Procedures / Treatments   Labs (all labs ordered are listed, but only abnormal results are displayed) Labs Reviewed - No data to display  EKG None  Radiology No results found.  Procedures Procedures    Medications Ordered in ED Medications  benzocaine (ORAJEL) 10 % mucosal gel (has no administration in time range)  ketorolac (TORADOL) 15 MG/ML injection 15 mg (15 mg Intramuscular Given 04/26/23 2120)    ED Course/ Medical Decision Making/ A&P                                 Medical Decision Making  Patient presents to the ED for concern of dental pain, this involves an extensive number of treatment options, and is a complaint that carries with it a high risk of complications and morbidity.  The differential diagnosis includes abscess, peritonsillar abscess, retropharyngeal abscess, dental pain from broken teeth or caries, cellulitis, Ludwig   Co morbidities that complicate the patient evaluation  Poor dentition   Additional history obtained:  Additional history obtained from Nursing   External records from outside source obtained and reviewed including triage RN note    Medicines ordered and prescription drug management:  I ordered medication including oragel, toradol, and meloxicam  for dental pain  Reevaluation of the patient after these medicines showed that the patient improved I have reviewed the patients home medicines and have made adjustments as needed    Problem List / ED Course:  Dental  pain TTP of left upper gums and molars. There are multiple teeth missing and dental caries.  Tonsils surgically absent No obvious infection based on exam. No fever nor tachycardia Low suspicion for emergent pathology such as Ludwig's due to no submandibular swelling or tenderness Low suspicion for retropharyngeal abscess, PTA Provided patient with Orajel and Toradol here in emergency department Will provide meloxicam prescription and dental follow-up   Reevaluation:  After the interventions noted above, I reevaluated the patient and found that they have :improved    Dispostion:  After consideration of the diagnostic results and the patients response to treatment, I feel that the patent would benefit from outpatient management and follow up with dentist.  Discussed ED workup, disposition, return to ED precautions with patient who expresses understanding agrees with plan.  All questions answered to their satisfaction.  They are agreeable to plan.  Discharge instructions provided on paperwork  Final Clinical Impression(s) / ED Diagnoses Final diagnoses:  Pain, dental    Rx / DC Orders ED Discharge Orders          Ordered    meloxicam (MOBIC) 7.5 MG tablet  Daily        04/26/23 2121              Judithann Sheen, PA 04/26/23 2125    Jacalyn Lefevre, MD 04/26/23 2156

## 2023-04-26 NOTE — ED Triage Notes (Signed)
 Pt coming in with broken teeth on bilateral upper back part of mouth. Ongoing for months. Patient coming in d/t pain becoming worse. No obvious swelling/drainage noted. Denies fevers.
